# Patient Record
Sex: Male | Born: 1955 | Race: Black or African American | Hispanic: No | Marital: Married | Smoking: Former smoker
Health system: Southern US, Community
[De-identification: ages and names within clinical notes are randomized; demographics above are authoritative.]

## PROBLEM LIST (undated history)

## (undated) DIAGNOSIS — K592 Neurogenic bowel, not elsewhere classified: Secondary | ICD-10-CM

## (undated) DIAGNOSIS — D649 Anemia, unspecified: Secondary | ICD-10-CM

## (undated) DIAGNOSIS — S14101A Unspecified injury at C1 level of cervical spinal cord, initial encounter: Secondary | ICD-10-CM

## (undated) DIAGNOSIS — Z5189 Encounter for other specified aftercare: Secondary | ICD-10-CM

## (undated) DIAGNOSIS — N319 Neuromuscular dysfunction of bladder, unspecified: Secondary | ICD-10-CM

## (undated) DIAGNOSIS — G808 Other cerebral palsy: Secondary | ICD-10-CM

## (undated) DIAGNOSIS — IMO0001 Reserved for inherently not codable concepts without codable children: Secondary | ICD-10-CM

## (undated) HISTORY — DX: Unspecified injury at C1 level of cervical spinal cord, initial encounter: S14.101A

## (undated) HISTORY — DX: Neuromuscular dysfunction of bladder, unspecified: N31.9

## (undated) HISTORY — PX: BACK SURGERY: SHX140

## (undated) HISTORY — DX: Other cerebral palsy: G80.8

## (undated) HISTORY — DX: Neurogenic bowel, not elsewhere classified: K59.2

---

## 1997-06-17 ENCOUNTER — Encounter: Admission: RE | Admit: 1997-06-17 | Discharge: 1997-09-15 | Payer: Self-pay | Admitting: *Deleted

## 2006-10-30 ENCOUNTER — Emergency Department (HOSPITAL_COMMUNITY): Admission: EM | Admit: 2006-10-30 | Discharge: 2006-10-31 | Payer: Self-pay | Admitting: Emergency Medicine

## 2006-11-06 ENCOUNTER — Encounter: Admission: RE | Admit: 2006-11-06 | Discharge: 2006-11-06 | Payer: Self-pay | Admitting: Family Medicine

## 2007-03-17 ENCOUNTER — Encounter: Admission: RE | Admit: 2007-03-17 | Discharge: 2007-04-15 | Payer: Self-pay | Admitting: Neurology

## 2007-10-30 ENCOUNTER — Encounter: Admission: RE | Admit: 2007-10-30 | Discharge: 2007-10-30 | Payer: Self-pay | Admitting: Neurology

## 2007-12-16 ENCOUNTER — Encounter: Admission: RE | Admit: 2007-12-16 | Discharge: 2007-12-16 | Payer: Self-pay | Admitting: Neurology

## 2008-05-19 ENCOUNTER — Inpatient Hospital Stay (HOSPITAL_COMMUNITY): Admission: RE | Admit: 2008-05-19 | Discharge: 2008-05-19 | Payer: Self-pay | Admitting: Neurosurgery

## 2008-12-28 ENCOUNTER — Ambulatory Visit (HOSPITAL_COMMUNITY): Admission: RE | Admit: 2008-12-28 | Discharge: 2008-12-28 | Payer: Self-pay | Admitting: Neurosurgery

## 2009-07-02 ENCOUNTER — Inpatient Hospital Stay (HOSPITAL_COMMUNITY): Admission: AC | Admit: 2009-07-02 | Discharge: 2009-07-27 | Payer: Self-pay

## 2009-07-06 ENCOUNTER — Ambulatory Visit: Payer: Self-pay | Admitting: Physical Medicine & Rehabilitation

## 2009-07-14 ENCOUNTER — Ambulatory Visit: Payer: Self-pay | Admitting: Surgery

## 2009-07-15 ENCOUNTER — Encounter (INDEPENDENT_AMBULATORY_CARE_PROVIDER_SITE_OTHER): Payer: Self-pay | Admitting: General Surgery

## 2009-07-27 ENCOUNTER — Ambulatory Visit: Payer: Self-pay | Admitting: Physical Medicine & Rehabilitation

## 2009-07-27 ENCOUNTER — Inpatient Hospital Stay (HOSPITAL_COMMUNITY)
Admission: RE | Admit: 2009-07-27 | Discharge: 2009-08-27 | Payer: Self-pay | Admitting: Physical Medicine & Rehabilitation

## 2009-07-30 ENCOUNTER — Ambulatory Visit: Payer: Self-pay | Admitting: Physical Medicine & Rehabilitation

## 2009-08-19 ENCOUNTER — Ambulatory Visit: Payer: Self-pay | Admitting: Physical Medicine & Rehabilitation

## 2009-08-19 ENCOUNTER — Ambulatory Visit: Payer: Self-pay | Admitting: Dentistry

## 2009-09-07 ENCOUNTER — Ambulatory Visit: Payer: Self-pay | Admitting: Emergency Medicine

## 2009-09-07 ENCOUNTER — Inpatient Hospital Stay (HOSPITAL_COMMUNITY): Admission: EM | Admit: 2009-09-07 | Discharge: 2009-09-29 | Payer: Self-pay | Admitting: Emergency Medicine

## 2009-09-14 ENCOUNTER — Ambulatory Visit: Payer: Self-pay | Admitting: Infectious Disease

## 2009-09-21 ENCOUNTER — Ambulatory Visit: Payer: Self-pay | Admitting: Physical Medicine & Rehabilitation

## 2009-09-22 ENCOUNTER — Encounter
Admission: RE | Admit: 2009-09-22 | Discharge: 2009-12-15 | Payer: Self-pay | Source: Home / Self Care | Attending: Physical Medicine & Rehabilitation | Admitting: Physical Medicine & Rehabilitation

## 2009-10-26 ENCOUNTER — Ambulatory Visit: Payer: Self-pay | Admitting: Physical Medicine & Rehabilitation

## 2009-10-27 ENCOUNTER — Encounter (HOSPITAL_BASED_OUTPATIENT_CLINIC_OR_DEPARTMENT_OTHER)
Admission: RE | Admit: 2009-10-27 | Discharge: 2010-01-25 | Payer: Self-pay | Source: Home / Self Care | Attending: General Surgery | Admitting: General Surgery

## 2009-11-23 ENCOUNTER — Ambulatory Visit: Payer: Self-pay | Admitting: Physical Medicine & Rehabilitation

## 2009-12-15 ENCOUNTER — Encounter
Admission: RE | Admit: 2009-12-15 | Discharge: 2009-12-22 | Payer: Self-pay | Source: Home / Self Care | Attending: Physical Medicine & Rehabilitation | Admitting: Physical Medicine & Rehabilitation

## 2009-12-22 ENCOUNTER — Ambulatory Visit: Payer: Self-pay | Admitting: Physical Medicine & Rehabilitation

## 2010-01-20 ENCOUNTER — Encounter
Admission: RE | Admit: 2010-01-20 | Discharge: 2010-01-31 | Payer: Self-pay | Source: Home / Self Care | Attending: Physical Medicine & Rehabilitation | Admitting: Physical Medicine & Rehabilitation

## 2010-01-22 ENCOUNTER — Encounter: Payer: Self-pay | Admitting: Physical Medicine & Rehabilitation

## 2010-01-25 ENCOUNTER — Ambulatory Visit
Admission: RE | Admit: 2010-01-25 | Discharge: 2010-01-25 | Payer: Self-pay | Source: Home / Self Care | Attending: Physical Medicine & Rehabilitation | Admitting: Physical Medicine & Rehabilitation

## 2010-02-03 ENCOUNTER — Encounter (HOSPITAL_BASED_OUTPATIENT_CLINIC_OR_DEPARTMENT_OTHER): Payer: BC Managed Care – PPO | Attending: General Surgery

## 2010-02-03 DIAGNOSIS — L8990 Pressure ulcer of unspecified site, unspecified stage: Secondary | ICD-10-CM | POA: Insufficient documentation

## 2010-02-03 DIAGNOSIS — L89209 Pressure ulcer of unspecified hip, unspecified stage: Secondary | ICD-10-CM | POA: Insufficient documentation

## 2010-02-03 DIAGNOSIS — L89899 Pressure ulcer of other site, unspecified stage: Secondary | ICD-10-CM | POA: Insufficient documentation

## 2010-02-03 DIAGNOSIS — L89009 Pressure ulcer of unspecified elbow, unspecified stage: Secondary | ICD-10-CM | POA: Insufficient documentation

## 2010-02-03 DIAGNOSIS — L89109 Pressure ulcer of unspecified part of back, unspecified stage: Secondary | ICD-10-CM | POA: Insufficient documentation

## 2010-02-03 DIAGNOSIS — G8220 Paraplegia, unspecified: Secondary | ICD-10-CM | POA: Insufficient documentation

## 2010-02-22 ENCOUNTER — Encounter: Payer: BC Managed Care – PPO | Attending: Physical Medicine & Rehabilitation

## 2010-02-22 ENCOUNTER — Ambulatory Visit: Payer: BC Managed Care – PPO | Admitting: Physical Medicine & Rehabilitation

## 2010-02-22 DIAGNOSIS — M25519 Pain in unspecified shoulder: Secondary | ICD-10-CM | POA: Insufficient documentation

## 2010-02-22 DIAGNOSIS — N319 Neuromuscular dysfunction of bladder, unspecified: Secondary | ICD-10-CM | POA: Insufficient documentation

## 2010-02-22 DIAGNOSIS — L8990 Pressure ulcer of unspecified site, unspecified stage: Secondary | ICD-10-CM

## 2010-02-22 DIAGNOSIS — K592 Neurogenic bowel, not elsewhere classified: Secondary | ICD-10-CM

## 2010-02-22 DIAGNOSIS — G8254 Quadriplegia, C5-C7 incomplete: Secondary | ICD-10-CM | POA: Insufficient documentation

## 2010-02-22 DIAGNOSIS — S14101A Unspecified injury at C1 level of cervical spinal cord, initial encounter: Secondary | ICD-10-CM

## 2010-02-22 DIAGNOSIS — M7500 Adhesive capsulitis of unspecified shoulder: Secondary | ICD-10-CM | POA: Insufficient documentation

## 2010-02-22 DIAGNOSIS — R259 Unspecified abnormal involuntary movements: Secondary | ICD-10-CM | POA: Insufficient documentation

## 2010-02-22 DIAGNOSIS — X58XXXS Exposure to other specified factors, sequela: Secondary | ICD-10-CM | POA: Insufficient documentation

## 2010-02-22 DIAGNOSIS — IMO0002 Reserved for concepts with insufficient information to code with codable children: Secondary | ICD-10-CM | POA: Insufficient documentation

## 2010-03-09 ENCOUNTER — Encounter (HOSPITAL_BASED_OUTPATIENT_CLINIC_OR_DEPARTMENT_OTHER): Payer: BC Managed Care – PPO | Attending: General Surgery

## 2010-03-09 DIAGNOSIS — L89209 Pressure ulcer of unspecified hip, unspecified stage: Secondary | ICD-10-CM | POA: Insufficient documentation

## 2010-03-09 DIAGNOSIS — G8220 Paraplegia, unspecified: Secondary | ICD-10-CM | POA: Insufficient documentation

## 2010-03-09 DIAGNOSIS — L89109 Pressure ulcer of unspecified part of back, unspecified stage: Secondary | ICD-10-CM | POA: Insufficient documentation

## 2010-03-09 DIAGNOSIS — L8990 Pressure ulcer of unspecified site, unspecified stage: Secondary | ICD-10-CM | POA: Insufficient documentation

## 2010-03-09 DIAGNOSIS — L89899 Pressure ulcer of other site, unspecified stage: Secondary | ICD-10-CM | POA: Insufficient documentation

## 2010-03-09 DIAGNOSIS — L89009 Pressure ulcer of unspecified elbow, unspecified stage: Secondary | ICD-10-CM | POA: Insufficient documentation

## 2010-03-16 LAB — BASIC METABOLIC PANEL
BUN: 1 mg/dL — ABNORMAL LOW (ref 6–23)
BUN: 1 mg/dL — ABNORMAL LOW (ref 6–23)
BUN: 2 mg/dL — ABNORMAL LOW (ref 6–23)
BUN: 2 mg/dL — ABNORMAL LOW (ref 6–23)
BUN: <1 mg/dL — ABNORMAL LOW (ref 6–23)
BUN: 24 mg/dL — ABNORMAL HIGH (ref 6–23)
BUN: 5 mg/dL — ABNORMAL LOW (ref 6–23)
BUN: 6 mg/dL (ref 6–23)
BUN: 6 mg/dL (ref 6–23)
BUN: 8 mg/dL (ref 6–23)
CO2: 19 mEq/L (ref 19–32)
CO2: 19 mEq/L (ref 19–32)
CO2: 21 mEq/L (ref 19–32)
CO2: 21 mEq/L (ref 19–32)
CO2: 22 mEq/L (ref 19–32)
CO2: 22 mEq/L (ref 19–32)
CO2: 25 mEq/L (ref 19–32)
CO2: 15 mEq/L — ABNORMAL LOW (ref 19–32)
CO2: 24 mEq/L (ref 19–32)
CO2: 26 mEq/L (ref 19–32)
Calcium: 7.8 mg/dL — ABNORMAL LOW (ref 8.4–10.5)
Calcium: 8 mg/dL — ABNORMAL LOW (ref 8.4–10.5)
Calcium: 8.1 mg/dL — ABNORMAL LOW (ref 8.4–10.5)
Calcium: 8.3 mg/dL — ABNORMAL LOW (ref 8.4–10.5)
Calcium: 8.5 mg/dL (ref 8.4–10.5)
Calcium: 8.8 mg/dL (ref 8.4–10.5)
Chloride: 113 mEq/L — ABNORMAL HIGH (ref 96–112)
Chloride: 115 mEq/L — ABNORMAL HIGH (ref 96–112)
Chloride: 115 mEq/L — ABNORMAL HIGH (ref 96–112)
Chloride: 117 mEq/L — ABNORMAL HIGH (ref 96–112)
Chloride: 118 mEq/L — ABNORMAL HIGH (ref 96–112)
Chloride: 102 mEq/L (ref 96–112)
Chloride: 108 mEq/L (ref 96–112)
Chloride: 110 mEq/L (ref 96–112)
Chloride: 113 mEq/L — ABNORMAL HIGH (ref 96–112)
Creatinine, Ser: 0.46 mg/dL (ref 0.4–1.5)
Creatinine, Ser: 0.51 mg/dL (ref 0.4–1.5)
Creatinine, Ser: 0.51 mg/dL (ref 0.4–1.5)
Creatinine, Ser: 0.57 mg/dL (ref 0.4–1.5)
Creatinine, Ser: 0.57 mg/dL (ref 0.4–1.5)
Creatinine, Ser: 0.58 mg/dL (ref 0.4–1.5)
Creatinine, Ser: 0.59 mg/dL (ref 0.4–1.5)
Creatinine, Ser: 0.6 mg/dL (ref 0.4–1.5)
Creatinine, Ser: 0.69 mg/dL (ref 0.4–1.5)
Creatinine, Ser: 0.7 mg/dL (ref 0.4–1.5)
Creatinine, Ser: 0.73 mg/dL (ref 0.4–1.5)
Creatinine, Ser: 1.84 mg/dL — ABNORMAL HIGH (ref 0.4–1.5)
GFR calc Af Amer: >60 mL/min (ref >60)
GFR calc Af Amer: >60 mL/min (ref >60)
GFR calc Af Amer: >60 mL/min (ref >60)
GFR calc Af Amer: >60 mL/min (ref >60)
GFR calc Af Amer: >60 mL/min (ref >60)
GFR calc Af Amer: >60 mL/min (ref >60)
GFR calc Af Amer: >60 mL/min (ref >60)
GFR calc non Af Amer: >60 mL/min (ref >60)
GFR calc non Af Amer: >60 mL/min (ref >60)
GFR calc non Af Amer: >60 mL/min (ref >60)
GFR calc non Af Amer: >60 mL/min (ref >60)
GFR calc non Af Amer: >60 mL/min (ref >60)
GFR calc non Af Amer: >60 mL/min (ref >60)
GFR calc non Af Amer: >60 mL/min (ref >60)
Glucose, Bld: 80 mg/dL (ref 70–99)
Glucose, Bld: 80 mg/dL (ref 70–99)
Glucose, Bld: 84 mg/dL (ref 70–99)
Glucose, Bld: 86 mg/dL (ref 70–99)
Glucose, Bld: 88 mg/dL (ref 70–99)
Glucose, Bld: 88 mg/dL (ref 70–99)
Glucose, Bld: 112 mg/dL — ABNORMAL HIGH (ref 70–99)
Glucose, Bld: 70 mg/dL (ref 70–99)
Glucose, Bld: 82 mg/dL (ref 70–99)
Glucose, Bld: 87 mg/dL (ref 70–99)
Potassium: 3 mEq/L — ABNORMAL LOW (ref 3.5–5.1)
Potassium: 3.3 mEq/L — ABNORMAL LOW (ref 3.5–5.1)
Potassium: 3.3 mEq/L — ABNORMAL LOW (ref 3.5–5.1)
Potassium: 2.3 mEq/L — CL (ref 3.5–5.1)
Potassium: 3 mEq/L — ABNORMAL LOW (ref 3.5–5.1)
Potassium: 3.1 mEq/L — ABNORMAL LOW (ref 3.5–5.1)
Potassium: 3.6 mEq/L (ref 3.5–5.1)
Sodium: 140 mEq/L (ref 135–145)
Sodium: 140 mEq/L (ref 135–145)
Sodium: 142 mEq/L (ref 135–145)
Sodium: 144 mEq/L (ref 135–145)
Sodium: 133 mEq/L — ABNORMAL LOW (ref 135–145)
Sodium: 135 mEq/L (ref 135–145)

## 2010-03-16 LAB — CBC
HCT: 21.4 % — ABNORMAL LOW (ref 39.0–52.0)
HCT: 22.4 % — ABNORMAL LOW (ref 39.0–52.0)
HCT: 22.5 % — ABNORMAL LOW (ref 39.0–52.0)
HCT: 23.4 % — ABNORMAL LOW (ref 39.0–52.0)
HCT: 24.6 % — ABNORMAL LOW (ref 39.0–52.0)
HCT: 24.5 % — ABNORMAL LOW (ref 39.0–52.0)
HCT: 24.9 % — ABNORMAL LOW (ref 39.0–52.0)
HCT: 25.9 % — ABNORMAL LOW (ref 39.0–52.0)
HCT: 27 % — ABNORMAL LOW (ref 39.0–52.0)
HCT: 27.1 % — ABNORMAL LOW (ref 39.0–52.0)
HCT: 32.3 % — ABNORMAL LOW (ref 39.0–52.0)
Hemoglobin: 7.1 g/dL — ABNORMAL LOW (ref 13.0–17.0)
Hemoglobin: 8.2 g/dL — ABNORMAL LOW (ref 13.0–17.0)
Hemoglobin: 8.3 g/dL — ABNORMAL LOW (ref 13.0–17.0)
Hemoglobin: 8.3 g/dL — ABNORMAL LOW (ref 13.0–17.0)
Hemoglobin: 11.1 g/dL — ABNORMAL LOW (ref 13.0–17.0)
Hemoglobin: 8.5 g/dL — ABNORMAL LOW (ref 13.0–17.0)
Hemoglobin: 8.5 g/dL — ABNORMAL LOW (ref 13.0–17.0)
Hemoglobin: 8.6 g/dL — ABNORMAL LOW (ref 13.0–17.0)
MCH: 26.6 pg (ref 26.0–34.0)
MCH: 26.9 pg (ref 26.0–34.0)
MCH: 26.9 pg (ref 26.0–34.0)
MCH: 26.9 pg (ref 26.0–34.0)
MCH: 27.3 pg (ref 26.0–34.0)
MCH: 27.3 pg (ref 26.0–34.0)
MCH: 27.5 pg (ref 26.0–34.0)
MCH: 27.1 pg (ref 26.0–34.0)
MCH: 27.2 pg (ref 26.0–34.0)
MCH: 27.3 pg (ref 26.0–34.0)
MCH: 27.5 pg (ref 26.0–34.0)
MCH: 27.5 pg (ref 26.0–34.0)
MCH: 28.1 pg (ref 26.0–34.0)
MCHC: 32.1 g/dL (ref 30.0–36.0)
MCHC: 32.5 g/dL (ref 30.0–36.0)
MCHC: 32.9 g/dL (ref 30.0–36.0)
MCHC: 33.2 g/dL (ref 30.0–36.0)
MCHC: 33.2 g/dL (ref 30.0–36.0)
MCHC: 33.5 g/dL (ref 30.0–36.0)
MCHC: 33.7 g/dL (ref 30.0–36.0)
MCHC: 34.1 g/dL (ref 30.0–36.0)
MCHC: 34.4 g/dL (ref 30.0–36.0)
MCHC: 34.7 g/dL (ref 30.0–36.0)
MCHC: 34.7 g/dL (ref 30.0–36.0)
MCV: 79.9 fL (ref 78.0–100.0)
MCV: 81.8 fL (ref 78.0–100.0)
MCV: 82.3 fL (ref 78.0–100.0)
MCV: 82.4 fL (ref 78.0–100.0)
MCV: 83.3 fL (ref 78.0–100.0)
MCV: 79.6 fL (ref 78.0–100.0)
MCV: 80.9 fL (ref 78.0–100.0)
MCV: 80.9 fL (ref 78.0–100.0)
MCV: 81.6 fL (ref 78.0–100.0)
Platelets: 527 10*3/uL — ABNORMAL HIGH (ref 150–400)
Platelets: 529 10*3/uL — ABNORMAL HIGH (ref 150–400)
Platelets: 548 10*3/uL — ABNORMAL HIGH (ref 150–400)
Platelets: 550 10*3/uL — ABNORMAL HIGH (ref 150–400)
Platelets: 551 10*3/uL — ABNORMAL HIGH (ref 150–400)
Platelets: 661 10*3/uL — ABNORMAL HIGH (ref 150–400)
Platelets: 669 10*3/uL — ABNORMAL HIGH (ref 150–400)
Platelets: 697 10*3/uL — ABNORMAL HIGH (ref 150–400)
Platelets: 289 10*3/uL (ref 150–400)
Platelets: 427 10*3/uL — ABNORMAL HIGH (ref 150–400)
Platelets: 483 10*3/uL — ABNORMAL HIGH (ref 150–400)
RBC: 2.78 MIL/uL — ABNORMAL LOW (ref 4.22–5.81)
RBC: 2.78 MIL/uL — ABNORMAL LOW (ref 4.22–5.81)
RBC: 2.84 MIL/uL — ABNORMAL LOW (ref 4.22–5.81)
RBC: 2.9 MIL/uL — ABNORMAL LOW (ref 4.22–5.81)
RBC: 3.01 MIL/uL — ABNORMAL LOW (ref 4.22–5.81)
RBC: 3.06 MIL/uL — ABNORMAL LOW (ref 4.22–5.81)
RBC: 3.06 MIL/uL — ABNORMAL LOW (ref 4.22–5.81)
RBC: 3.08 MIL/uL — ABNORMAL LOW (ref 4.22–5.81)
RBC: 3.17 MIL/uL — ABNORMAL LOW (ref 4.22–5.81)
RBC: 3.67 MIL/uL — ABNORMAL LOW (ref 4.22–5.81)
RDW: 16.7 % — ABNORMAL HIGH (ref 11.5–15.5)
RDW: 17.1 % — ABNORMAL HIGH (ref 11.5–15.5)
RDW: 17.5 % — ABNORMAL HIGH (ref 11.5–15.5)
RDW: 17.8 % — ABNORMAL HIGH (ref 11.5–15.5)
RDW: 17.8 % — ABNORMAL HIGH (ref 11.5–15.5)
RDW: 15.2 % (ref 11.5–15.5)
RDW: 15.2 % (ref 11.5–15.5)
RDW: 15.6 % — ABNORMAL HIGH (ref 11.5–15.5)
RDW: 15.8 % — ABNORMAL HIGH (ref 11.5–15.5)
WBC: 15 10*3/uL — ABNORMAL HIGH (ref 4.0–10.5)
WBC: 18.9 10*3/uL — ABNORMAL HIGH (ref 4.0–10.5)
WBC: 19.2 10*3/uL — ABNORMAL HIGH (ref 4.0–10.5)
WBC: 7.6 10*3/uL (ref 4.0–10.5)
WBC: 8.1 10*3/uL (ref 4.0–10.5)
WBC: 8.9 10*3/uL (ref 4.0–10.5)
WBC: 17.8 10*3/uL — ABNORMAL HIGH (ref 4.0–10.5)
WBC: 23.1 10*3/uL — ABNORMAL HIGH (ref 4.0–10.5)
WBC: 23.4 10*3/uL — ABNORMAL HIGH (ref 4.0–10.5)

## 2010-03-16 LAB — COMPREHENSIVE METABOLIC PANEL
ALT: 22 U/L (ref 0–53)
ALT: 27 U/L (ref 0–53)
AST: 42 U/L — ABNORMAL HIGH (ref 0–37)
Albumin: 1.7 g/dL — ABNORMAL LOW (ref 3.5–5.2)
Alkaline Phosphatase: 73 U/L (ref 39–117)
CO2: 19 mEq/L (ref 19–32)
CO2: 19 mEq/L (ref 19–32)
Calcium: 7.5 mg/dL — ABNORMAL LOW (ref 8.4–10.5)
Calcium: 7.6 mg/dL — ABNORMAL LOW (ref 8.4–10.5)
Calcium: 8 mg/dL — ABNORMAL LOW (ref 8.4–10.5)
Chloride: 109 mEq/L (ref 96–112)
Chloride: 110 mEq/L (ref 96–112)
Creatinine, Ser: 2.75 mg/dL — ABNORMAL HIGH (ref 0.4–1.5)
GFR calc Af Amer: 29 mL/min — ABNORMAL LOW (ref >60)
GFR calc non Af Amer: 13 mL/min — ABNORMAL LOW (ref >60)
GFR calc non Af Amer: 56 mL/min — ABNORMAL LOW (ref >60)
Glucose, Bld: 117 mg/dL — ABNORMAL HIGH (ref 70–99)
Glucose, Bld: 90 mg/dL (ref 70–99)
Sodium: 132 mEq/L — ABNORMAL LOW (ref 135–145)
Sodium: 138 mEq/L (ref 135–145)
Total Bilirubin: 0.4 mg/dL (ref 0.3–1.2)
Total Bilirubin: 0.7 mg/dL (ref 0.3–1.2)

## 2010-03-16 LAB — DIFFERENTIAL
Basophils Absolute: 0 10*3/uL (ref 0.0–0.1)
Basophils Absolute: 0 10*3/uL (ref 0.0–0.1)
Basophils Relative: 0 % (ref 0–1)
Eosinophils Absolute: 0 10*3/uL (ref 0.0–0.7)
Eosinophils Absolute: 0 10*3/uL (ref 0.0–0.7)
Eosinophils Relative: 0 % (ref 0–5)
Lymphocytes Relative: 3 % — ABNORMAL LOW (ref 12–46)
Lymphocytes Relative: 6 % — ABNORMAL LOW (ref 12–46)
Lymphs Abs: 1.1 10*3/uL (ref 0.7–4.0)
Lymphs Abs: 3.6 10*3/uL (ref 0.7–4.0)
Monocytes Absolute: 1.1 10*3/uL — ABNORMAL HIGH (ref 0.1–1.0)
Monocytes Absolute: 2.4 10*3/uL — ABNORMAL HIGH (ref 0.1–1.0)
Monocytes Relative: 6 % (ref 3–12)
Monocytes Relative: 7 % (ref 3–12)
Monocytes Relative: 9 % (ref 3–12)
Neutro Abs: 19.2 10*3/uL — ABNORMAL HIGH (ref 1.7–7.7)
Neutro Abs: 27.7 10*3/uL — ABNORMAL HIGH (ref 1.7–7.7)
Neutro Abs: 34.4 10*3/uL — ABNORMAL HIGH (ref 1.7–7.7)
Neutrophils Relative %: 75 % (ref 43–77)
Neutrophils Relative %: 86 % — ABNORMAL HIGH (ref 43–77)

## 2010-03-16 LAB — MRSA PCR SCREENING: MRSA by PCR: NEGATIVE

## 2010-03-16 LAB — CLOSTRIDIUM DIFFICILE EIA: C difficile Toxins A+B, EIA: NEGATIVE

## 2010-03-16 LAB — GIARDIA/CRYPTOSPORIDIUM SCREEN(EIA)
Cryptosporidium Screen (EIA): NEGATIVE
Giardia Screen - EIA: NEGATIVE

## 2010-03-16 LAB — POCT I-STAT 3, ART BLOOD GAS (G3+)
Bicarbonate: 21.9 mEq/L (ref 20.0–24.0)
O2 Saturation: 97 %
TCO2: 23 mmol/L (ref 0–100)
pCO2 arterial: 29.6 mmHg — ABNORMAL LOW (ref 35.0–45.0)
pCO2 arterial: 32.1 mmHg — ABNORMAL LOW (ref 35.0–45.0)
pH, Arterial: 7.316 — ABNORMAL LOW (ref 7.350–7.450)
pH, Arterial: 7.442 (ref 7.350–7.450)
pO2, Arterial: 81 mmHg (ref 80.0–100.0)

## 2010-03-16 LAB — GLUCOSE, CAPILLARY
Glucose-Capillary: 83 mg/dL (ref 70–99)
Glucose-Capillary: 85 mg/dL (ref 70–99)

## 2010-03-16 LAB — URINALYSIS, ROUTINE W REFLEX MICROSCOPIC
Glucose, UA: NEGATIVE mg/dL
Specific Gravity, Urine: 1.021 (ref 1.005–1.030)

## 2010-03-16 LAB — TYPE AND SCREEN
ABO/RH(D): A POS
Antibody Screen: NEGATIVE

## 2010-03-16 LAB — IRON AND TIBC

## 2010-03-16 LAB — URINE MICROSCOPIC-ADD ON

## 2010-03-16 LAB — CULTURE, BLOOD (ROUTINE X 2)
Culture: NO GROWTH
Culture: NO GROWTH

## 2010-03-16 LAB — PHOSPHORUS
Phosphorus: 2.9 mg/dL (ref 2.3–4.6)
Phosphorus: 4.3 mg/dL (ref 2.3–4.6)

## 2010-03-16 LAB — HEMOGLOBIN A1C: Mean Plasma Glucose: 111 mg/dL (ref <117)

## 2010-03-16 LAB — LACTIC ACID, PLASMA: Lactic Acid, Venous: 0.9 mmol/L (ref 0.5–2.2)

## 2010-03-16 LAB — TROPONIN I
Troponin I: 0.03 ng/mL (ref 0.00–0.06)
Troponin I: 0.07 ng/mL — ABNORMAL HIGH (ref 0.00–0.06)

## 2010-03-16 LAB — CARBOXYHEMOGLOBIN
Carboxyhemoglobin: 0.8 % (ref 0.5–1.5)
Methemoglobin: 0.8 % (ref 0.0–1.5)
O2 Saturation: 64.1 %
O2 Saturation: 76.6 %
Total hemoglobin: 9.6 g/dL — ABNORMAL LOW (ref 13.5–18.0)

## 2010-03-16 LAB — CK TOTAL AND CKMB (NOT AT ARMC)
CK, MB: 1.6 ng/mL (ref 0.3–4.0)
Relative Index: INVALID (ref 0.0–2.5)

## 2010-03-16 LAB — PROCALCITONIN: Procalcitonin: 7.69 ng/mL

## 2010-03-16 LAB — URINE CULTURE: Culture: NO GROWTH

## 2010-03-16 LAB — LIPASE, BLOOD: Lipase: 15 U/L (ref 11–59)

## 2010-03-16 LAB — TSH: TSH: 2.073 u[IU]/mL (ref 0.350–4.500)

## 2010-03-16 LAB — FOLATE: Folate: 8.5 ng/mL

## 2010-03-16 LAB — PROTEIN, URINE, RANDOM: Total Protein, Urine: 117 mg/dL

## 2010-03-16 LAB — HIV ANTIBODY (ROUTINE TESTING W REFLEX): HIV: NONREACTIVE

## 2010-03-16 LAB — OSMOLALITY, URINE: Osmolality, Ur: 439 mOsm/kg (ref 390–1090)

## 2010-03-16 LAB — STOOL CULTURE

## 2010-03-16 LAB — SEDIMENTATION RATE: Sed Rate: 17 mm/hr — ABNORMAL HIGH (ref 0–16)

## 2010-03-16 LAB — RETICULOCYTES
RBC.: 2.74 MIL/uL — ABNORMAL LOW (ref 4.22–5.81)
Retic Ct Pct: 0.9 % (ref 0.4–3.1)

## 2010-03-16 LAB — FECAL LACTOFERRIN, QUANT: Fecal Lactoferrin: POSITIVE

## 2010-03-17 LAB — BASIC METABOLIC PANEL
BUN: 6 mg/dL (ref 6–23)
Calcium: 9.5 mg/dL (ref 8.4–10.5)
Chloride: 102 mEq/L (ref 96–112)
GFR calc Af Amer: >60 mL/min (ref >60)
GFR calc non Af Amer: >60 mL/min (ref >60)
Glucose, Bld: 93 mg/dL (ref 70–99)
Glucose, Bld: 93 mg/dL (ref 70–99)
Potassium: 3.7 mEq/L (ref 3.5–5.1)
Sodium: 134 mEq/L — ABNORMAL LOW (ref 135–145)

## 2010-03-17 LAB — URINALYSIS, ROUTINE W REFLEX MICROSCOPIC
Glucose, UA: NEGATIVE mg/dL
Hgb urine dipstick: NEGATIVE
Specific Gravity, Urine: 1.016 (ref 1.005–1.030)
pH: 6.5 (ref 5.0–8.0)

## 2010-03-17 LAB — CBC
HCT: 27.4 % — ABNORMAL LOW (ref 39.0–52.0)
HCT: 30.1 % — ABNORMAL LOW (ref 39.0–52.0)
HCT: 27.3 % — ABNORMAL LOW (ref 39.0–52.0)
Hemoglobin: 9.3 g/dL — ABNORMAL LOW (ref 13.0–17.0)
MCH: 28.7 pg (ref 26.0–34.0)
MCH: 29.1 pg (ref 26.0–34.0)
MCHC: 32.8 g/dL (ref 30.0–36.0)
MCHC: 32.9 g/dL (ref 30.0–36.0)
MCHC: 32.4 g/dL (ref 30.0–36.0)
MCV: 87 fL (ref 78.0–100.0)
MCV: 87.2 fL (ref 78.0–100.0)
MCV: 89.3 fL (ref 78.0–100.0)
MCV: 90.7 fL (ref 78.0–100.0)
Platelets: 329 10*3/uL (ref 150–400)
Platelets: 339 10*3/uL (ref 150–400)
Platelets: 602 10*3/uL — ABNORMAL HIGH (ref 150–400)
RBC: 3.27 MIL/uL — ABNORMAL LOW (ref 4.22–5.81)
RBC: 3.36 MIL/uL — ABNORMAL LOW (ref 4.22–5.81)
RBC: 3.01 MIL/uL — ABNORMAL LOW (ref 4.22–5.81)
RBC: 3.19 MIL/uL — ABNORMAL LOW (ref 4.22–5.81)
RDW: 14.6 % (ref 11.5–15.5)
RDW: 15.6 % — ABNORMAL HIGH (ref 11.5–15.5)
WBC: 17.3 10*3/uL — ABNORMAL HIGH (ref 4.0–10.5)
WBC: 7 10*3/uL (ref 4.0–10.5)
WBC: 10.1 10*3/uL (ref 4.0–10.5)

## 2010-03-17 LAB — URINE CULTURE

## 2010-03-17 LAB — DIFFERENTIAL
Eosinophils Absolute: 0.2 10*3/uL (ref 0.0–0.7)
Eosinophils Relative: 0 % (ref 0–5)
Lymphocytes Relative: 19 % (ref 12–46)
Lymphocytes Relative: 23 % (ref 12–46)
Lymphs Abs: 1.6 10*3/uL (ref 0.7–4.0)
Lymphs Abs: 3.2 10*3/uL (ref 0.7–4.0)
Neutrophils Relative %: 71 % (ref 43–77)

## 2010-03-17 LAB — URINE MICROSCOPIC-ADD ON

## 2010-03-17 LAB — COMPREHENSIVE METABOLIC PANEL
Alkaline Phosphatase: 118 U/L — ABNORMAL HIGH (ref 39–117)
BUN: 12 mg/dL (ref 6–23)
Chloride: 97 mEq/L (ref 96–112)
GFR calc non Af Amer: >60 mL/min (ref >60)
Glucose, Bld: 97 mg/dL (ref 70–99)
Potassium: 4 mEq/L (ref 3.5–5.1)
Total Bilirubin: 0.3 mg/dL (ref 0.3–1.2)

## 2010-03-17 LAB — CULTURE, BLOOD (ROUTINE X 2)

## 2010-03-17 LAB — APTT: aPTT: 38 seconds — ABNORMAL HIGH (ref 24–37)

## 2010-03-17 LAB — GLUCOSE, CAPILLARY: Glucose-Capillary: 106 mg/dL — ABNORMAL HIGH (ref 70–99)

## 2010-03-18 LAB — COMPREHENSIVE METABOLIC PANEL
Albumin: 2.8 g/dL — ABNORMAL LOW (ref 3.5–5.2)
Alkaline Phosphatase: 148 U/L — ABNORMAL HIGH (ref 39–117)
Alkaline Phosphatase: 196 U/L — ABNORMAL HIGH (ref 39–117)
BUN: 17 mg/dL (ref 6–23)
BUN: 12 mg/dL (ref 6–23)
CO2: 35 mEq/L — ABNORMAL HIGH (ref 19–32)
Calcium: 9.5 mg/dL (ref 8.4–10.5)
Chloride: 92 mEq/L — ABNORMAL LOW (ref 96–112)
Creatinine, Ser: 0.76 mg/dL (ref 0.4–1.5)
GFR calc non Af Amer: >60 mL/min (ref >60)
Glucose, Bld: 122 mg/dL — ABNORMAL HIGH (ref 70–99)
Potassium: 4.4 mEq/L (ref 3.5–5.1)
Potassium: 4.8 mEq/L (ref 3.5–5.1)
Total Bilirubin: 0.5 mg/dL (ref 0.3–1.2)
Total Protein: 6.9 g/dL (ref 6.0–8.3)

## 2010-03-18 LAB — BLOOD GAS, ARTERIAL
Bicarbonate: 37.5 mEq/L — ABNORMAL HIGH (ref 20.0–24.0)
Drawn by: 317771
MECHVT: 500 mL
O2 Saturation: 98.2 %
PEEP: 8 cmH2O
Patient temperature: 98.7
RATE: 15 resp/min
TCO2: 39.2 mmol/L (ref 0–100)
pCO2 arterial: 70.9 mmHg (ref 35.0–45.0)
pH, Arterial: 7.449 (ref 7.350–7.450)
pO2, Arterial: 106 mmHg — ABNORMAL HIGH (ref 80.0–100.0)
pO2, Arterial: 72 mmHg — ABNORMAL LOW (ref 80.0–100.0)

## 2010-03-18 LAB — BASIC METABOLIC PANEL
BUN: 12 mg/dL (ref 6–23)
BUN: 13 mg/dL (ref 6–23)
CO2: 26 mEq/L (ref 19–32)
CO2: 28 mEq/L (ref 19–32)
CO2: 29 mEq/L (ref 19–32)
Calcium: 9 mg/dL (ref 8.4–10.5)
Calcium: 9.1 mg/dL (ref 8.4–10.5)
Calcium: 9.2 mg/dL (ref 8.4–10.5)
Calcium: 9.8 mg/dL (ref 8.4–10.5)
Chloride: 101 mEq/L (ref 96–112)
Chloride: 101 mEq/L (ref 96–112)
Chloride: 96 mEq/L (ref 96–112)
Creatinine, Ser: 0.64 mg/dL (ref 0.4–1.5)
Creatinine, Ser: 0.67 mg/dL (ref 0.4–1.5)
GFR calc Af Amer: >60 mL/min (ref >60)
GFR calc Af Amer: >60 mL/min (ref >60)
GFR calc Af Amer: >60 mL/min (ref >60)
GFR calc Af Amer: >60 mL/min (ref >60)
GFR calc non Af Amer: >60 mL/min (ref >60)
GFR calc non Af Amer: >60 mL/min (ref >60)
Glucose, Bld: 121 mg/dL — ABNORMAL HIGH (ref 70–99)
Glucose, Bld: 158 mg/dL — ABNORMAL HIGH (ref 70–99)
Potassium: 4.3 mEq/L (ref 3.5–5.1)
Potassium: 4.4 mEq/L (ref 3.5–5.1)
Sodium: 130 mEq/L — ABNORMAL LOW (ref 135–145)
Sodium: 137 mEq/L (ref 135–145)

## 2010-03-18 LAB — CBC
HCT: 20.5 % — ABNORMAL LOW (ref 39.0–52.0)
HCT: 20.6 % — ABNORMAL LOW (ref 39.0–52.0)
HCT: 21.2 % — ABNORMAL LOW (ref 39.0–52.0)
HCT: 21.8 % — ABNORMAL LOW (ref 39.0–52.0)
HCT: 22.2 % — ABNORMAL LOW (ref 39.0–52.0)
HCT: 24.6 % — ABNORMAL LOW (ref 39.0–52.0)
Hemoglobin: 6.9 g/dL — CL (ref 13.0–17.0)
Hemoglobin: 7 g/dL — ABNORMAL LOW (ref 13.0–17.0)
Hemoglobin: 7.2 g/dL — ABNORMAL LOW (ref 13.0–17.0)
Hemoglobin: 8.1 g/dL — ABNORMAL LOW (ref 13.0–17.0)
Hemoglobin: 8.4 g/dL — ABNORMAL LOW (ref 13.0–17.0)
MCH: 31 pg (ref 26.0–34.0)
MCH: 31.3 pg (ref 26.0–34.0)
MCH: 31.6 pg (ref 26.0–34.0)
MCHC: 32.6 g/dL (ref 30.0–36.0)
MCHC: 32.7 g/dL (ref 30.0–36.0)
MCHC: 33 g/dL (ref 30.0–36.0)
MCHC: 33.3 g/dL (ref 30.0–36.0)
MCHC: 33.4 g/dL (ref 30.0–36.0)
MCHC: 33.8 g/dL (ref 30.0–36.0)
MCV: 94.1 fL (ref 78.0–100.0)
MCV: 94.8 fL (ref 78.0–100.0)
MCV: 94.9 fL (ref 78.0–100.0)
MCV: 95.6 fL (ref 78.0–100.0)
MCV: 96 fL (ref 78.0–100.0)
Platelets: 597 10*3/uL — ABNORMAL HIGH (ref 150–400)
Platelets: 646 10*3/uL — ABNORMAL HIGH (ref 150–400)
Platelets: 652 10*3/uL — ABNORMAL HIGH (ref 150–400)
Platelets: 662 10*3/uL — ABNORMAL HIGH (ref 150–400)
Platelets: 677 10*3/uL — ABNORMAL HIGH (ref 150–400)
RBC: 2.19 MIL/uL — ABNORMAL LOW (ref 4.22–5.81)
RBC: 2.2 MIL/uL — ABNORMAL LOW (ref 4.22–5.81)
RBC: 2.24 MIL/uL — ABNORMAL LOW (ref 4.22–5.81)
RBC: 2.27 MIL/uL — ABNORMAL LOW (ref 4.22–5.81)
RBC: 2.27 MIL/uL — ABNORMAL LOW (ref 4.22–5.81)
RBC: 2.7 MIL/uL — ABNORMAL LOW (ref 4.22–5.81)
RDW: 15.9 % — ABNORMAL HIGH (ref 11.5–15.5)
RDW: 14.6 % (ref 11.5–15.5)
RDW: 14.9 % (ref 11.5–15.5)
RDW: 15.1 % (ref 11.5–15.5)
RDW: 15.2 % (ref 11.5–15.5)
RDW: 15.4 % (ref 11.5–15.5)
WBC: 9.9 10*3/uL (ref 4.0–10.5)
WBC: 14 10*3/uL — ABNORMAL HIGH (ref 4.0–10.5)
WBC: 14.7 10*3/uL — ABNORMAL HIGH (ref 4.0–10.5)
WBC: 15.9 10*3/uL — ABNORMAL HIGH (ref 4.0–10.5)
WBC: 16.5 10*3/uL — ABNORMAL HIGH (ref 4.0–10.5)
WBC: 16.8 10*3/uL — ABNORMAL HIGH (ref 4.0–10.5)
WBC: 28.2 10*3/uL — ABNORMAL HIGH (ref 4.0–10.5)

## 2010-03-18 LAB — GLUCOSE, CAPILLARY
Glucose-Capillary: 106 mg/dL — ABNORMAL HIGH (ref 70–99)
Glucose-Capillary: 119 mg/dL — ABNORMAL HIGH (ref 70–99)
Glucose-Capillary: 88 mg/dL (ref 70–99)
Glucose-Capillary: 91 mg/dL (ref 70–99)
Glucose-Capillary: 92 mg/dL (ref 70–99)
Glucose-Capillary: 93 mg/dL (ref 70–99)
Glucose-Capillary: 99 mg/dL (ref 70–99)
Glucose-Capillary: 103 mg/dL — ABNORMAL HIGH (ref 70–99)
Glucose-Capillary: 91 mg/dL (ref 70–99)

## 2010-03-18 LAB — VITAMIN B12: Vitamin B-12: 774 pg/mL (ref 211–911)

## 2010-03-18 LAB — TYPE AND SCREEN: Antibody Screen: NEGATIVE

## 2010-03-18 LAB — PROTIME-INR: Prothrombin Time: 15.1 seconds (ref 11.6–15.2)

## 2010-03-18 LAB — DIFFERENTIAL
Eosinophils Absolute: 0.3 10*3/uL (ref 0.0–0.7)
Eosinophils Absolute: 0.3 10*3/uL (ref 0.0–0.7)
Eosinophils Relative: 2 % (ref 0–5)
Lymphocytes Relative: 29 % (ref 12–46)
Lymphocytes Relative: 12 % (ref 12–46)
Lymphocytes Relative: 16 % (ref 12–46)
Lymphs Abs: 2.9 10*3/uL (ref 0.7–4.0)
Lymphs Abs: 2.2 10*3/uL (ref 0.7–4.0)
Lymphs Abs: 2.8 10*3/uL (ref 0.7–4.0)
Monocytes Absolute: 1 10*3/uL (ref 0.1–1.0)
Monocytes Absolute: 0.6 10*3/uL (ref 0.1–1.0)
Monocytes Absolute: 0.9 10*3/uL (ref 0.1–1.0)
Monocytes Absolute: 1.2 10*3/uL — ABNORMAL HIGH (ref 0.1–1.0)
Monocytes Relative: 10 % (ref 3–12)
Monocytes Relative: 7 % (ref 3–12)
Neutro Abs: 5.5 10*3/uL (ref 1.7–7.7)
Neutro Abs: 13.3 10*3/uL — ABNORMAL HIGH (ref 1.7–7.7)
Neutrophils Relative %: 76 % (ref 43–77)
Neutrophils Relative %: 92 % — ABNORMAL HIGH (ref 43–77)

## 2010-03-18 LAB — MRSA PCR SCREENING: MRSA by PCR: NEGATIVE

## 2010-03-18 LAB — VANCOMYCIN, TROUGH: Vancomycin Tr: 18.1 ug/mL (ref 10.0–20.0)

## 2010-03-18 LAB — CARDIAC PANEL(CRET KIN+CKTOT+MB+TROPI)
Relative Index: INVALID (ref 0.0–2.5)
Troponin I: 0.03 ng/mL (ref 0.00–0.06)

## 2010-03-18 LAB — IRON AND TIBC
Iron: 35 ug/dL — ABNORMAL LOW (ref 42–135)
Saturation Ratios: 11 % — ABNORMAL LOW (ref 20–55)
TIBC: 317 ug/dL (ref 215–435)

## 2010-03-18 LAB — RETICULOCYTES: Retic Count, Absolute: 61.7 10*3/uL (ref 19.0–186.0)

## 2010-03-19 LAB — BASIC METABOLIC PANEL
BUN: 10 mg/dL (ref 6–23)
BUN: 11 mg/dL (ref 6–23)
BUN: 11 mg/dL (ref 6–23)
BUN: 12 mg/dL (ref 6–23)
BUN: 6 mg/dL (ref 6–23)
CO2: 25 mEq/L (ref 19–32)
CO2: 31 mEq/L (ref 19–32)
Calcium: 7.3 mg/dL — ABNORMAL LOW (ref 8.4–10.5)
Calcium: 8 mg/dL — ABNORMAL LOW (ref 8.4–10.5)
Chloride: 106 mEq/L (ref 96–112)
Chloride: 96 mEq/L (ref 96–112)
Chloride: 99 mEq/L (ref 96–112)
Creatinine, Ser: 0.76 mg/dL (ref 0.4–1.5)
Creatinine, Ser: 0.84 mg/dL (ref 0.4–1.5)
Creatinine, Ser: 1.06 mg/dL (ref 0.4–1.5)
GFR calc Af Amer: >60 mL/min (ref >60)
GFR calc Af Amer: >60 mL/min (ref >60)
GFR calc non Af Amer: >60 mL/min (ref >60)
GFR calc non Af Amer: >60 mL/min (ref >60)
GFR calc non Af Amer: >60 mL/min (ref >60)
GFR calc non Af Amer: >60 mL/min (ref >60)
Glucose, Bld: 160 mg/dL — ABNORMAL HIGH (ref 70–99)
Glucose, Bld: 94 mg/dL (ref 70–99)
Potassium: 3.6 mEq/L (ref 3.5–5.1)
Potassium: 4 mEq/L (ref 3.5–5.1)
Potassium: 4.2 mEq/L (ref 3.5–5.1)
Potassium: 4.8 mEq/L (ref 3.5–5.1)
Sodium: 130 mEq/L — ABNORMAL LOW (ref 135–145)
Sodium: 135 mEq/L (ref 135–145)
Sodium: 137 mEq/L (ref 135–145)

## 2010-03-19 LAB — CBC
HCT: 20.5 % — ABNORMAL LOW (ref 39.0–52.0)
HCT: 22 % — ABNORMAL LOW (ref 39.0–52.0)
HCT: 23 % — ABNORMAL LOW (ref 39.0–52.0)
HCT: 24 % — ABNORMAL LOW (ref 39.0–52.0)
HCT: 24.5 % — ABNORMAL LOW (ref 39.0–52.0)
HCT: 25.1 % — ABNORMAL LOW (ref 39.0–52.0)
HCT: 25.2 % — ABNORMAL LOW (ref 39.0–52.0)
HCT: 37.2 % — ABNORMAL LOW (ref 39.0–52.0)
Hemoglobin: 12.8 g/dL — ABNORMAL LOW (ref 13.0–17.0)
Hemoglobin: 7 g/dL — ABNORMAL LOW (ref 13.0–17.0)
Hemoglobin: 7.3 g/dL — ABNORMAL LOW (ref 13.0–17.0)
Hemoglobin: 7.5 g/dL — ABNORMAL LOW (ref 13.0–17.0)
Hemoglobin: 7.6 g/dL — ABNORMAL LOW (ref 13.0–17.0)
Hemoglobin: 8.1 g/dL — ABNORMAL LOW (ref 13.0–17.0)
Hemoglobin: 8.5 g/dL — ABNORMAL LOW (ref 13.0–17.0)
Hemoglobin: 8.5 g/dL — ABNORMAL LOW (ref 13.0–17.0)
MCH: 31.6 pg (ref 26.0–34.0)
MCH: 31.6 pg (ref 26.0–34.0)
MCH: 31.8 pg (ref 26.0–34.0)
MCH: 32.1 pg (ref 26.0–34.0)
MCH: 32.1 pg (ref 26.0–34.0)
MCH: 32.1 pg (ref 26.0–34.0)
MCH: 32.2 pg (ref 26.0–34.0)
MCH: 32.4 pg (ref 26.0–34.0)
MCH: 32.4 pg (ref 26.0–34.0)
MCHC: 32.9 g/dL (ref 30.0–36.0)
MCHC: 32.9 g/dL (ref 30.0–36.0)
MCHC: 33.2 g/dL (ref 30.0–36.0)
MCHC: 33.5 g/dL (ref 30.0–36.0)
MCHC: 33.9 g/dL (ref 30.0–36.0)
MCHC: 34.1 g/dL (ref 30.0–36.0)
MCHC: 34.1 g/dL (ref 30.0–36.0)
MCV: 94.9 fL (ref 78.0–100.0)
MCV: 95.3 fL (ref 78.0–100.0)
MCV: 95.6 fL (ref 78.0–100.0)
MCV: 95.8 fL (ref 78.0–100.0)
MCV: 95.9 fL (ref 78.0–100.0)
MCV: 96 fL (ref 78.0–100.0)
MCV: 96.4 fL (ref 78.0–100.0)
MCV: 96.7 fL (ref 78.0–100.0)
Platelets: 134 10*3/uL — ABNORMAL LOW (ref 150–400)
Platelets: 148 K/uL — ABNORMAL LOW (ref 150–400)
Platelets: 256 10*3/uL (ref 150–400)
Platelets: 310 10*3/uL (ref 150–400)
Platelets: 476 K/uL — ABNORMAL HIGH (ref 150–400)
Platelets: 540 K/uL — ABNORMAL HIGH (ref 150–400)
RBC: 2.16 MIL/uL — ABNORMAL LOW (ref 4.22–5.81)
RBC: 2.36 MIL/uL — ABNORMAL LOW (ref 4.22–5.81)
RBC: 2.4 MIL/uL — ABNORMAL LOW (ref 4.22–5.81)
RBC: 2.51 MIL/uL — ABNORMAL LOW (ref 4.22–5.81)
RBC: 2.63 MIL/uL — ABNORMAL LOW (ref 4.22–5.81)
RBC: 3.94 MIL/uL — ABNORMAL LOW (ref 4.22–5.81)
RDW: 13.6 % (ref 11.5–15.5)
RDW: 13.9 % (ref 11.5–15.5)
RDW: 14 % (ref 11.5–15.5)
RDW: 14.2 % (ref 11.5–15.5)
RDW: 14.3 % (ref 11.5–15.5)
RDW: 14.5 % (ref 11.5–15.5)
RDW: 14.8 % (ref 11.5–15.5)
RDW: 15 % (ref 11.5–15.5)
WBC: 13.1 10*3/uL — ABNORMAL HIGH (ref 4.0–10.5)
WBC: 13.3 10*3/uL — ABNORMAL HIGH (ref 4.0–10.5)
WBC: 13.8 10*3/uL — ABNORMAL HIGH (ref 4.0–10.5)
WBC: 18 K/uL — ABNORMAL HIGH (ref 4.0–10.5)
WBC: 21.4 K/uL — ABNORMAL HIGH (ref 4.0–10.5)
WBC: 22.2 10*3/uL — ABNORMAL HIGH (ref 4.0–10.5)
WBC: 22.5 10*3/uL — ABNORMAL HIGH (ref 4.0–10.5)
WBC: 22.9 10*3/uL — ABNORMAL HIGH (ref 4.0–10.5)
WBC: 26.2 K/uL — ABNORMAL HIGH (ref 4.0–10.5)

## 2010-03-19 LAB — COMPREHENSIVE METABOLIC PANEL
ALT: 17 U/L (ref 0–53)
ALT: 23 U/L (ref 0–53)
AST: 28 U/L (ref 0–37)
AST: 38 U/L — ABNORMAL HIGH (ref 0–37)
Albumin: 1.8 g/dL — ABNORMAL LOW (ref 3.5–5.2)
Albumin: 2.1 g/dL — ABNORMAL LOW (ref 3.5–5.2)
Alkaline Phosphatase: 54 U/L (ref 39–117)
BUN: 10 mg/dL (ref 6–23)
CO2: 20 meq/L (ref 19–32)
Calcium: 8 mg/dL — ABNORMAL LOW (ref 8.4–10.5)
Calcium: 8.1 mg/dL — ABNORMAL LOW (ref 8.4–10.5)
Chloride: 106 meq/L (ref 96–112)
Creatinine, Ser: 0.85 mg/dL (ref 0.4–1.5)
Creatinine, Ser: 1.58 mg/dL — ABNORMAL HIGH (ref 0.4–1.5)
GFR calc Af Amer: 55 mL/min — ABNORMAL LOW (ref >60)
GFR calc Af Amer: >60 mL/min (ref >60)
GFR calc non Af Amer: 46 mL/min — ABNORMAL LOW (ref >60)
Potassium: 3.6 mEq/L (ref 3.5–5.1)
Sodium: 137 mEq/L (ref 135–145)
Total Bilirubin: 0.4 mg/dL (ref 0.3–1.2)
Total Protein: 4.9 g/dL — ABNORMAL LOW (ref 6.0–8.3)
Total Protein: 5.1 g/dL — ABNORMAL LOW (ref 6.0–8.3)

## 2010-03-19 LAB — POCT I-STAT 7, (LYTES, BLD GAS, ICA,H+H)
Acid-base deficit: 2 mmol/L (ref 0.0–2.0)
Acid-base deficit: 8 mmol/L — ABNORMAL HIGH (ref 0.0–2.0)
Bicarbonate: 23.5 meq/L (ref 20.0–24.0)
Calcium, Ion: 1.08 mmol/L — ABNORMAL LOW (ref 1.12–1.32)
HCT: 29 % — ABNORMAL LOW (ref 39.0–52.0)
Hemoglobin: 9.9 g/dL — ABNORMAL LOW (ref 13.0–17.0)
O2 Saturation: 100 %
Patient temperature: 35.3
TCO2: 20 mmol/L (ref 0–100)
pCO2 arterial: 38.5 mmHg (ref 35.0–45.0)
pO2, Arterial: 388 mmHg — ABNORMAL HIGH (ref 80.0–100.0)
pO2, Arterial: 437 mmHg — ABNORMAL HIGH (ref 80.0–100.0)

## 2010-03-19 LAB — COMPREHENSIVE METABOLIC PANEL WITH GFR
ALT: 110 U/L — ABNORMAL HIGH (ref 0–53)
AST: 56 U/L — ABNORMAL HIGH (ref 0–37)
Albumin: 1.7 g/dL — ABNORMAL LOW (ref 3.5–5.2)
Alkaline Phosphatase: 133 U/L — ABNORMAL HIGH (ref 39–117)
BUN: 10 mg/dL (ref 6–23)
CO2: 29 meq/L (ref 19–32)
Calcium: 8.1 mg/dL — ABNORMAL LOW (ref 8.4–10.5)
Chloride: 101 meq/L (ref 96–112)
Creatinine, Ser: 0.82 mg/dL (ref 0.4–1.5)
GFR calc non Af Amer: >60 mL/min
Glucose, Bld: 149 mg/dL — ABNORMAL HIGH (ref 70–99)
Potassium: 4.2 meq/L (ref 3.5–5.1)
Sodium: 135 meq/L (ref 135–145)
Total Bilirubin: 0.9 mg/dL (ref 0.3–1.2)
Total Protein: 5.3 g/dL — ABNORMAL LOW (ref 6.0–8.3)

## 2010-03-19 LAB — BLOOD GAS, ARTERIAL
Acid-Base Excess: 2.5 mmol/L — ABNORMAL HIGH (ref 0.0–2.0)
Acid-Base Excess: 3 mmol/L — ABNORMAL HIGH (ref 0.0–2.0)
Bicarbonate: 26.3 mEq/L — ABNORMAL HIGH (ref 20.0–24.0)
Bicarbonate: 26.8 mEq/L — ABNORMAL HIGH (ref 20.0–24.0)
Bicarbonate: 28.1 meq/L — ABNORMAL HIGH (ref 20.0–24.0)
Drawn by: 24487
Drawn by: 270271
FIO2: 0.6 %
MECHVT: 500 mL
MECHVT: 500 mL
O2 Saturation: 94.1 %
O2 Saturation: 95.5 %
O2 Saturation: 99 %
PEEP: 5 cmH2O
PEEP: 8 cmH2O
Patient temperature: 98.6
Patient temperature: 98.6
RATE: 15 resp/min
RATE: 15 {breaths}/min
TCO2: 29.7 mmol/L (ref 0–100)
pCO2 arterial: 51.6 mmHg — ABNORMAL HIGH (ref 35.0–45.0)
pH, Arterial: 7.355 (ref 7.350–7.450)
pO2, Arterial: 62.5 mmHg — ABNORMAL LOW (ref 80.0–100.0)
pO2, Arterial: 83.3 mmHg (ref 80.0–100.0)

## 2010-03-19 LAB — BASIC METABOLIC PANEL WITH GFR
BUN: 10 mg/dL (ref 6–23)
BUN: 10 mg/dL (ref 6–23)
CO2: 26 meq/L (ref 19–32)
CO2: 27 meq/L (ref 19–32)
Calcium: 7.8 mg/dL — ABNORMAL LOW (ref 8.4–10.5)
Calcium: 7.9 mg/dL — ABNORMAL LOW (ref 8.4–10.5)
Chloride: 101 meq/L (ref 96–112)
Chloride: 101 meq/L (ref 96–112)
Creatinine, Ser: 0.95 mg/dL (ref 0.4–1.5)
Creatinine, Ser: 0.98 mg/dL (ref 0.4–1.5)
GFR calc non Af Amer: >60 mL/min
GFR calc non Af Amer: >60 mL/min
Glucose, Bld: 113 mg/dL — ABNORMAL HIGH (ref 70–99)
Glucose, Bld: 142 mg/dL — ABNORMAL HIGH (ref 70–99)
Potassium: 3.4 meq/L — ABNORMAL LOW (ref 3.5–5.1)
Potassium: 3.5 meq/L (ref 3.5–5.1)
Sodium: 132 meq/L — ABNORMAL LOW (ref 135–145)
Sodium: 134 meq/L — ABNORMAL LOW (ref 135–145)

## 2010-03-19 LAB — TYPE AND SCREEN: ABO/RH(D): A POS

## 2010-03-19 LAB — EXPECTORATED SPUTUM ASSESSMENT W GRAM STAIN, RFLX TO RESP C

## 2010-03-19 LAB — DIFFERENTIAL
Basophils Absolute: 0 10*3/uL (ref 0.0–0.1)
Basophils Absolute: 0 10*3/uL (ref 0.0–0.1)
Basophils Absolute: 0 K/uL (ref 0.0–0.1)
Basophils Absolute: 0.2 10*3/uL — ABNORMAL HIGH (ref 0.0–0.1)
Basophils Relative: 0 % (ref 0–1)
Basophils Relative: 1 % (ref 0–1)
Eosinophils Absolute: 0 10*3/uL (ref 0.0–0.7)
Eosinophils Absolute: 0 10*3/uL (ref 0.0–0.7)
Eosinophils Absolute: 0.2 10*3/uL (ref 0.0–0.7)
Eosinophils Absolute: 0.2 K/uL (ref 0.0–0.7)
Eosinophils Relative: 1 % (ref 0–5)
Lymphocytes Relative: 10 % — ABNORMAL LOW (ref 12–46)
Lymphocytes Relative: 7 % — ABNORMAL LOW (ref 12–46)
Lymphocytes Relative: 8 % — ABNORMAL LOW (ref 12–46)
Lymphocytes Relative: 9 % — ABNORMAL LOW (ref 12–46)
Lymphs Abs: 1.2 K/uL (ref 0.7–4.0)
Lymphs Abs: 1.8 10*3/uL (ref 0.7–4.0)
Lymphs Abs: 2.4 10*3/uL (ref 0.7–4.0)
Monocytes Absolute: 0.8 10*3/uL (ref 0.1–1.0)
Monocytes Absolute: 1.3 K/uL — ABNORMAL HIGH (ref 0.1–1.0)
Monocytes Absolute: 1.8 10*3/uL — ABNORMAL HIGH (ref 0.1–1.0)
Monocytes Relative: 10 % (ref 3–12)
Neutro Abs: 11.1 K/uL — ABNORMAL HIGH (ref 1.7–7.7)
Neutro Abs: 18.6 10*3/uL — ABNORMAL HIGH (ref 1.7–7.7)
Neutro Abs: 20.2 10*3/uL — ABNORMAL HIGH (ref 1.7–7.7)
Neutrophils Relative %: 80 % — ABNORMAL HIGH (ref 43–77)
Neutrophils Relative %: 90 % — ABNORMAL HIGH (ref 43–77)

## 2010-03-19 LAB — PHOSPHORUS: Phosphorus: 2.6 mg/dL (ref 2.3–4.6)

## 2010-03-19 LAB — URINALYSIS, ROUTINE W REFLEX MICROSCOPIC
Glucose, UA: NEGATIVE mg/dL
Hgb urine dipstick: NEGATIVE
Specific Gravity, Urine: 1.03 (ref 1.005–1.030)

## 2010-03-19 LAB — APTT: aPTT: 26 s (ref 24–37)

## 2010-03-19 LAB — URINE CULTURE
Colony Count: NO GROWTH
Colony Count: NO GROWTH
Culture: NO GROWTH
Culture: NO GROWTH

## 2010-03-19 LAB — CULTURE, BAL-QUANTITATIVE W GRAM STAIN
Colony Count: 1000
Colony Count: NO GROWTH
Culture: NO GROWTH

## 2010-03-19 LAB — RAPID URINE DRUG SCREEN, HOSP PERFORMED
Amphetamines: NOT DETECTED
Barbiturates: NOT DETECTED
Benzodiazepines: POSITIVE — AB
Opiates: NOT DETECTED

## 2010-03-19 LAB — POCT I-STAT, CHEM 8
BUN: 8 mg/dL (ref 6–23)
Calcium, Ion: 1.07 mmol/L — ABNORMAL LOW (ref 1.12–1.32)
Chloride: 106 meq/L (ref 96–112)
Glucose, Bld: 90 mg/dL (ref 70–99)

## 2010-03-19 LAB — CULTURE, RESPIRATORY W GRAM STAIN

## 2010-03-19 LAB — PROTIME-INR: INR: 1.21 (ref 0.00–1.49)

## 2010-03-19 LAB — CULTURE, BLOOD (ROUTINE X 2)
Culture: NO GROWTH
Culture: NO GROWTH
Culture: NO GROWTH

## 2010-03-19 LAB — ABO/RH: ABO/RH(D): A POS

## 2010-03-19 LAB — MRSA PCR SCREENING: MRSA by PCR: NEGATIVE

## 2010-03-19 LAB — MAGNESIUM: Magnesium: 1.7 mg/dL (ref 1.5–2.5)

## 2010-03-19 LAB — VANCOMYCIN, TROUGH: Vancomycin Tr: 12.5 ug/mL (ref 10.0–20.0)

## 2010-03-28 ENCOUNTER — Encounter: Payer: BC Managed Care – PPO | Attending: Physical Medicine & Rehabilitation

## 2010-03-28 ENCOUNTER — Ambulatory Visit: Payer: BC Managed Care – PPO | Admitting: Physical Medicine & Rehabilitation

## 2010-04-11 LAB — BASIC METABOLIC PANEL
CO2: 25 mEq/L (ref 19–32)
Calcium: 9.6 mg/dL (ref 8.4–10.5)
GFR calc Af Amer: >60 mL/min (ref >60)
Potassium: 4.3 mEq/L (ref 3.5–5.1)
Sodium: 139 mEq/L (ref 135–145)

## 2010-04-11 LAB — CBC
Hemoglobin: 12.7 g/dL — ABNORMAL LOW (ref 13.0–17.0)
MCHC: 34.3 g/dL (ref 30.0–36.0)
RBC: 4 MIL/uL — ABNORMAL LOW (ref 4.22–5.81)

## 2010-04-14 ENCOUNTER — Encounter (HOSPITAL_BASED_OUTPATIENT_CLINIC_OR_DEPARTMENT_OTHER): Payer: BC Managed Care – PPO | Attending: General Surgery

## 2010-04-14 DIAGNOSIS — L89899 Pressure ulcer of other site, unspecified stage: Secondary | ICD-10-CM | POA: Insufficient documentation

## 2010-04-14 DIAGNOSIS — L89209 Pressure ulcer of unspecified hip, unspecified stage: Secondary | ICD-10-CM | POA: Insufficient documentation

## 2010-04-14 DIAGNOSIS — L8990 Pressure ulcer of unspecified site, unspecified stage: Secondary | ICD-10-CM | POA: Insufficient documentation

## 2010-04-14 DIAGNOSIS — G8220 Paraplegia, unspecified: Secondary | ICD-10-CM | POA: Insufficient documentation

## 2010-04-14 DIAGNOSIS — L89009 Pressure ulcer of unspecified elbow, unspecified stage: Secondary | ICD-10-CM | POA: Insufficient documentation

## 2010-04-14 DIAGNOSIS — L89109 Pressure ulcer of unspecified part of back, unspecified stage: Secondary | ICD-10-CM | POA: Insufficient documentation

## 2010-04-25 ENCOUNTER — Encounter
Payer: BC Managed Care – PPO | Attending: Physical Medicine & Rehabilitation | Admitting: Physical Medicine & Rehabilitation

## 2010-04-25 DIAGNOSIS — L8990 Pressure ulcer of unspecified site, unspecified stage: Secondary | ICD-10-CM | POA: Insufficient documentation

## 2010-04-25 DIAGNOSIS — IMO0002 Reserved for concepts with insufficient information to code with codable children: Secondary | ICD-10-CM | POA: Insufficient documentation

## 2010-04-25 DIAGNOSIS — R259 Unspecified abnormal involuntary movements: Secondary | ICD-10-CM | POA: Insufficient documentation

## 2010-04-25 DIAGNOSIS — X58XXXS Exposure to other specified factors, sequela: Secondary | ICD-10-CM | POA: Insufficient documentation

## 2010-04-25 DIAGNOSIS — K592 Neurogenic bowel, not elsewhere classified: Secondary | ICD-10-CM

## 2010-04-25 DIAGNOSIS — G808 Other cerebral palsy: Secondary | ICD-10-CM

## 2010-04-25 DIAGNOSIS — G8254 Quadriplegia, C5-C7 incomplete: Secondary | ICD-10-CM | POA: Insufficient documentation

## 2010-04-25 DIAGNOSIS — N319 Neuromuscular dysfunction of bladder, unspecified: Secondary | ICD-10-CM | POA: Insufficient documentation

## 2010-04-25 DIAGNOSIS — M7500 Adhesive capsulitis of unspecified shoulder: Secondary | ICD-10-CM | POA: Insufficient documentation

## 2010-04-25 DIAGNOSIS — S14101A Unspecified injury at C1 level of cervical spinal cord, initial encounter: Secondary | ICD-10-CM

## 2010-04-25 NOTE — Assessment & Plan Note (Signed)
[name] is back regarding his spinal cord injury.  Pain in general is better.  The Neurontin felt some of the shooting pains.  We had to switch to OxyContin due to unavailability of the Opana, this has caused some sedation and patient's wife want him to switch back.  He has occasional spasms in the lower extremities.  His wounds are appearing better.  His weight is stable.  Blood pressure is bit lower lately per wife.  He maintains on baclofen at the current dosing.  REVIEW OF SYSTEMS:  Notable for the above.  He does report occasional spasms when transferring or moving.  Full 12-point review is in the written health and history section of the chart.  SOCIAL HISTORY:  The patient is married, wife is with him once again today.  PHYSICAL EXAMINATION:  VITAL SIGNS:  Blood pressure is 83/33, pulse is 80, respiratory rate 18, he is satting 98% on room air. GENERAL:  The patient is generally pleasant, but appears lethargic and sleepy. MUSCULOSKELETAL:  He has tightness particularly in the right biceps and brachioradialis, as well as bilateral pec major and minor muscles and left wrist finger flexors.  Tone in the range of 2-3/4 on the right and 2/4 on the left.  Teres major muscles may be involved as well.  Sacral and lower extremity wounds were not visualized as these were addressed and I left these alone today.  He has 1/2 touch below the level of his injury.  He has actually very good passive range of motion in both legs today.  He did have some clonus and excitable reflexes. HEART:  Regular. CHEST:  Clear. ABDOMEN:  Soft, nontender.  ASSESSMENT: 1. 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 2. Diffuse spasticity upper greater than lower. 3. Bilateral adhesive capsulitis of the shoulders. 4. Multiple pressure wounds. 5. Neurogenic bowel and bladder.  PLAN: 1. We will change him back to Opana ER 20 mg q.12 hours as he was     doing quite well with this per  wife. 2. Refill oxycodone 5 mg one q.6 hours p.r.n. #90. 3. He will stay with Neurontin for neuropathic pain, spasms, as well     as his current baclofen dosing. 4. We will set him up for Botox injections to the right biceps,     brachii, and brachioradialis, left FDP, FDS, and flexor carpi     muscles as well.  Consider bilateral pec major/minor injections     also. 5. Sedation is likely due to his OxyContin as wife noticed a change     when he switched to this.  We will observe for improvement off the     OxyContin.  If no further change is seen, we will need look at     workup including Infectious Disease, electrolytes, blood counts,     etc. 6. I will see him back for injections as above.  Wife is asked to call     me with any problems or concerns.     Ranelle Oyster, M.D. Electronically Signed    ZTS/MedQ D:  04/25/2010 11:22:57  T:  04/25/2010 23:44:07  Job #:  [id number]

## 2010-05-05 ENCOUNTER — Encounter (HOSPITAL_BASED_OUTPATIENT_CLINIC_OR_DEPARTMENT_OTHER): Payer: BC Managed Care – PPO | Attending: General Surgery

## 2010-05-05 DIAGNOSIS — L89009 Pressure ulcer of unspecified elbow, unspecified stage: Secondary | ICD-10-CM | POA: Insufficient documentation

## 2010-05-05 DIAGNOSIS — L89209 Pressure ulcer of unspecified hip, unspecified stage: Secondary | ICD-10-CM | POA: Insufficient documentation

## 2010-05-05 DIAGNOSIS — L8990 Pressure ulcer of unspecified site, unspecified stage: Secondary | ICD-10-CM | POA: Insufficient documentation

## 2010-05-05 DIAGNOSIS — L8993 Pressure ulcer of unspecified site, stage 3: Secondary | ICD-10-CM | POA: Insufficient documentation

## 2010-05-05 DIAGNOSIS — L89609 Pressure ulcer of unspecified heel, unspecified stage: Secondary | ICD-10-CM | POA: Insufficient documentation

## 2010-05-05 DIAGNOSIS — L89109 Pressure ulcer of unspecified part of back, unspecified stage: Secondary | ICD-10-CM | POA: Insufficient documentation

## 2010-05-05 DIAGNOSIS — Z79899 Other long term (current) drug therapy: Secondary | ICD-10-CM | POA: Insufficient documentation

## 2010-05-16 NOTE — Op Note (Signed)
NAMEDEWEL, LOTTER NO.:  [id number]   MEDICAL RECORD NO.:  [id number]          PATIENT TYPE:  OIB   LOCATION:  3599                         FACILITY:  [facility]   PHYSICIAN:  Cristi Loron, M.D.DATE OF BIRTH:  Oct 26, 1955   DATE OF PROCEDURE:  05/19/2008  DATE OF DISCHARGE:                               OPERATIVE REPORT   BRIEF HISTORY:  The patient is a 55 year old black male who suffered  from back and right leg pain consistent with a right S1 radiculopathy.  He failed medical management and was worked up with lumbar MRI which  demonstrated that he had herniated disk at L5-S1 on the right.  I  discussed the various treatment options with the patient including  surgery.  He has weighed the risks, benefits, and alternatives of  surgery and decided to proceed with a right L5-S1 diskectomy.   PREOPERATIVE DIAGNOSES:  Right L5-S1 herniated nucleus pulposus, spinal  stenosis, lumbar radiculopathy, and lumbago.   POSTOPERATIVE DIAGNOSES:  Right L5-S1 herniated nucleus pulposus, spinal  stenosis, lumbar radiculopathy, and lumbago.   PROCEDURE:  Right L5-S1 diskectomy using microdissection.   SURGEON:  Cristi Loron, MD   ASSISTANT:  Danae Orleans. Venetia Maxon, MD   ANESTHESIA:  General endotracheal.   ESTIMATED BLOOD LOSS:  25 mL.   SPECIMENS:  None.   DRAINS:  None.   COMPLICATIONS:  None.   DESCRIPTION OF PROCEDURE:  The patient was brought to the operating room  by Anesthesia Team.  General endotracheal anesthesia was induced.  The  patient was turned to the prone position on the Wilson frame.  His  lumbosacral region was then prepared with Betadine scrub and Betadine  solution.  Sterile drapes were applied.  I then injected the area to be  incised with Marcaine with epinephrine solution.  I used a scalpel to  make a linear midline incision over the L5-S1 interspace.  I used  electrocautery to perform a right-sided subperiosteal dissection  exposing the  right spinous process, lamina of L5 in the upper sacrum.  We obtained intraoperative radiograph to confirm location and inserted  the [facility] retractor for exposure.   We then brought the operative microscope into the field and under its  magnification and illumination, we completed the  microdissection/decompression.  I used a high-speed drill to perform a  right L5 laminotomy.  I widened the laminotomy with Kerrison punch  removing the right L5-S1 ligamentum flavum.  We then used  microdissection to free up the thecal sac and the S1 nerve root.  Dr.  Venetia Maxon then gently retracted the thecal sac and the S1 nerve root  medially with a D'Errico retractor.  This exposed the underlying disk  herniation and part of it had migrated caudally over the upper S1  vertebral body.  I incised the L5-S1 intervertebral disk and performed  partial intervertebral diskectomy using the pituitary forceps and the  Epstein curettes.  I then used osteophyte tool to remove some redundant  posterior longitudinal ligament, spondylosis from the vertebral  endplates at L5-S1 further decompressing the neural elements.  I then  palpated along the ventral surface of the thecal sac along the exit  route of the right S1 nerve root and noted the neural structure was well-  decompressed.  We obtained hemostasis using bipolar electrocautery.  We  irrigated the wound with bacitracin solution.  We then removed the  retractor and then reapproximated the patient's right thoracolumbar  fascia with interrupted #1 Vicryl suture, subcutaneous tissue with  interrupted 2-0 Vicryl suture, and the skin with Steri-Strips and  benzoin.  The wound was then coated with bacitracin ointment.  Sterile  dressing was applied.  The drapes were removed.  The patient was  subsequently returned to the supine position where he was extubated by  the Anesthesia Team and transported to the Postanesthesia Care Unit in  stable condition.  All sponge,  instrument, and needle counts were  correct at the end of the case.      Cristi Loron, M.D.  Electronically Signed     JDJ/MEDQ  D:  05/19/2008  T:  05/19/2008  Job:  [id number]

## 2010-05-19 NOTE — Discharge Summary (Signed)
NAMEZEBULIN, SIEGEL NO.:  [id number]   MEDICAL RECORD NO.:  [id number]          PATIENT TYPE:  INP   LOCATION:  3535                         FACILITY:  [facility]   PHYSICIAN:  Cristi Loron, M.D.DATE OF BIRTH:  May 14, 1955   DATE OF ADMISSION:  05/19/2008  DATE OF DISCHARGE:  05/19/2008                               DISCHARGE SUMMARY   BRIEF HISTORY:  The patient is a 55 year old black male who has suffered  from back and right leg pain consistent with a right S1 radiculopathy.  He has failed medical management and was worked up with a lumbar MRI  which demonstrated he had a herniated disk at L5-S1 on right.  I  discussed various treatment options with the patient including surgery.  The patient has weighed the risks, benefits, and alternatives of surgery  and decided to proceed with a right L5-S1 diskectomy.   For further details of this admission, please refer to typed history and  physical.   HOSPITAL COURSE:  I admitted to [facility] on May 19, 2008.  On  day of admission, I performed a right L5-S1 diskectomy.  Surgery went  well (for full details of the operation, please refer to operative of  note).   POSTOPERATIVE COURSE:  The patient's postoperative course was  unremarkable.  He was discharged home on postop day #1, i.e. May 19, 2008.   DISCHARGE INSTRUCTIONS:  The patient was given written discharge  instructions, instructed to follow up with me in 4 weeks.   FINAL DIAGNOSES:  Right L5-S1 herniated nucleus pulposus, spinal  stenosis, lumbar radiculopathy, and lumbago.   PROCEDURE PERFORMED:  Right L5-S1 diskectomy using microdissection.      Cristi Loron, M.D.  Electronically Signed     JDJ/MEDQ  D:  06/17/2008  T:  06/18/2008  Job:  [id number]

## 2010-06-06 ENCOUNTER — Encounter: Payer: Medicare Other | Attending: Physical Medicine & Rehabilitation | Admitting: Physical Medicine & Rehabilitation

## 2010-06-06 DIAGNOSIS — S14101A Unspecified injury at C1 level of cervical spinal cord, initial encounter: Secondary | ICD-10-CM

## 2010-06-06 DIAGNOSIS — M7530 Calcific tendinitis of unspecified shoulder: Secondary | ICD-10-CM

## 2010-06-06 DIAGNOSIS — G8254 Quadriplegia, C5-C7 incomplete: Secondary | ICD-10-CM | POA: Insufficient documentation

## 2010-06-06 DIAGNOSIS — L989 Disorder of the skin and subcutaneous tissue, unspecified: Secondary | ICD-10-CM | POA: Insufficient documentation

## 2010-06-06 DIAGNOSIS — X58XXXS Exposure to other specified factors, sequela: Secondary | ICD-10-CM | POA: Insufficient documentation

## 2010-06-06 DIAGNOSIS — IMO0002 Reserved for concepts with insufficient information to code with codable children: Secondary | ICD-10-CM | POA: Insufficient documentation

## 2010-06-06 DIAGNOSIS — L8990 Pressure ulcer of unspecified site, unspecified stage: Secondary | ICD-10-CM

## 2010-06-06 DIAGNOSIS — N319 Neuromuscular dysfunction of bladder, unspecified: Secondary | ICD-10-CM | POA: Insufficient documentation

## 2010-06-06 DIAGNOSIS — K592 Neurogenic bowel, not elsewhere classified: Secondary | ICD-10-CM | POA: Insufficient documentation

## 2010-06-06 DIAGNOSIS — G8110 Spastic hemiplegia affecting unspecified side: Secondary | ICD-10-CM

## 2010-06-06 DIAGNOSIS — M7500 Adhesive capsulitis of unspecified shoulder: Secondary | ICD-10-CM | POA: Insufficient documentation

## 2010-06-06 NOTE — Assessment & Plan Note (Signed)
Jailyn is back regarding his spinal cord injury.  He saw Dr. Jeral Fruit, recently who wondered if he might benefit from orthopedic evaluation of the shoulders.  We had ordered Botox in the last visit and apparently, he has got Medicare and is approved for this now.  Shoulder is still causing pain.  He is on Opana and oxycodone for symptoms.  I think overall pain is a bit better, but still a big problem.  He wants to get back into physical therapy.  REVIEW OF SYSTEMS:  Notable for spasms, tingling, weakness.  Full 12- point review is in the written health and history section of the chart.  SOCIAL HISTORY:  The patient is married.  Wife is with him today.  PHYSICAL EXAMINATION:  VITAL SIGNS:  Blood pressure is 73/36, pulse 74, respiratory rate 18, satting 98% on room air. GENERAL:  The patient is flat, sitting in chair, in no acute distress. Weight is unchanged. MUSCULOSKELETAL:  Shoulders are both tight especially, right shoulder today.  He has tightness in both pec major minor muscles, as well as teres major and latissimus dorsi in the right.  Left side similar minus latissimus dorsi muscle.  The biceps brachii and brachioradialis are also tight on the right side.  A great tone in the shoulders, right shoulder at 2-3/4, elbow 2/4, left shoulder 2/4.  Motor and sensory exam are unchanged. HEART:  Regular. CHEST:  Clear. ABDOMEN:  Soft, nontender.  ASSESSMENT: 1. 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 2. Bilateral adhesive capsulitis of the shoulders. 3. Neurogenic bowel and bladder. 4. Multiple wounds.  PLAN: 1. After informed consent, we injected the right pec major and minor     with 100 units of Botox.  The teres major and latissimus dorsi in     the right with 100 units of Botox total, biceps brachii and     brachioradialis muscles with 100 units of Botox in the left pec     major and minor as well as part of the teres major  with a total of     100 units Botox.  Each 100 units was diluted in 1 mL preservative-     free normal saline.  Diagnostic code for these injections were     342.11, 342.12. 2. Initially, we injected the bilateral shoulders with 40 mg Kenalog     and 3 mL of 1% lidocaine.  The patient tolerated well. 3. Refill Opana and oxycodone. 4. Send patient for outpatient Physical and Occupational Therapy, work     on range of motion and mobility. 5. Consider Orthopedic consult for shoulder manipulation.     Ranelle Oyster, M.D. Electronically Signed    ZTS/MedQ D:  06/06/2010 11:17:06  T:  06/06/2010 23:21:26  Job #:  [id number]

## 2010-06-16 ENCOUNTER — Encounter (HOSPITAL_BASED_OUTPATIENT_CLINIC_OR_DEPARTMENT_OTHER): Payer: Medicare Other | Attending: General Surgery

## 2010-06-16 DIAGNOSIS — L8990 Pressure ulcer of unspecified site, unspecified stage: Secondary | ICD-10-CM | POA: Insufficient documentation

## 2010-06-16 DIAGNOSIS — Z79899 Other long term (current) drug therapy: Secondary | ICD-10-CM | POA: Insufficient documentation

## 2010-06-16 DIAGNOSIS — L89109 Pressure ulcer of unspecified part of back, unspecified stage: Secondary | ICD-10-CM | POA: Insufficient documentation

## 2010-06-16 DIAGNOSIS — L89009 Pressure ulcer of unspecified elbow, unspecified stage: Secondary | ICD-10-CM | POA: Insufficient documentation

## 2010-06-16 DIAGNOSIS — L8993 Pressure ulcer of unspecified site, stage 3: Secondary | ICD-10-CM | POA: Insufficient documentation

## 2010-06-16 DIAGNOSIS — L89209 Pressure ulcer of unspecified hip, unspecified stage: Secondary | ICD-10-CM | POA: Insufficient documentation

## 2010-06-16 DIAGNOSIS — L89609 Pressure ulcer of unspecified heel, unspecified stage: Secondary | ICD-10-CM | POA: Insufficient documentation

## 2010-06-27 ENCOUNTER — Ambulatory Visit: Payer: Medicare Other | Admitting: Physical Therapy

## 2010-06-27 ENCOUNTER — Ambulatory Visit: Payer: Medicare Other | Admitting: Occupational Therapy

## 2010-07-12 ENCOUNTER — Encounter: Payer: Medicare Other | Attending: Physical Medicine & Rehabilitation | Admitting: Physical Medicine & Rehabilitation

## 2010-07-12 DIAGNOSIS — N319 Neuromuscular dysfunction of bladder, unspecified: Secondary | ICD-10-CM | POA: Insufficient documentation

## 2010-07-12 DIAGNOSIS — X58XXXA Exposure to other specified factors, initial encounter: Secondary | ICD-10-CM | POA: Insufficient documentation

## 2010-07-12 DIAGNOSIS — K592 Neurogenic bowel, not elsewhere classified: Secondary | ICD-10-CM | POA: Insufficient documentation

## 2010-07-12 DIAGNOSIS — S14105A Unspecified injury at C5 level of cervical spinal cord, initial encounter: Secondary | ICD-10-CM | POA: Insufficient documentation

## 2010-07-12 DIAGNOSIS — M7500 Adhesive capsulitis of unspecified shoulder: Secondary | ICD-10-CM | POA: Insufficient documentation

## 2010-07-12 DIAGNOSIS — G8254 Quadriplegia, C5-C7 incomplete: Secondary | ICD-10-CM | POA: Insufficient documentation

## 2010-07-12 DIAGNOSIS — G808 Other cerebral palsy: Secondary | ICD-10-CM

## 2010-07-12 DIAGNOSIS — IMO0002 Reserved for concepts with insufficient information to code with codable children: Secondary | ICD-10-CM | POA: Insufficient documentation

## 2010-07-12 DIAGNOSIS — M7530 Calcific tendinitis of unspecified shoulder: Secondary | ICD-10-CM

## 2010-07-12 DIAGNOSIS — S14101A Unspecified injury at C1 level of cervical spinal cord, initial encounter: Secondary | ICD-10-CM

## 2010-07-12 NOTE — Assessment & Plan Note (Signed)
HISTORY:  [name] is back regarding his spinal cord injury and spasticity.  We injected his right biceps teres major latissimus dorsi, pec major and minor at last visit as well as his biceps brachii and brachioradialis muscles.  I used total 400 units of Botox.  He tolerated these injections quite well.  The patient is improved his range of motion in therapy and pain is gone down as well.  Even being off his Opana which they could not require due to insurance and cost reasons. Pain today is about 6/10.  Wounds are healing.  He still has small spot on the left heels as well as the sacral region but these are improving. A small spot in the right elbow which is essentially a scab now.  The patient is not doing much the way of exercise on his own at home.  His wife does help him with range of motion at home.  The patient does report some constipation at times.  REVIEW OF SYSTEMS:  Full review is in the written health and history section, other pertinent positives above.  SOCIAL HISTORY:  Unchanged.  The patient is married and wife is with him today.  PHYSICAL EXAMINATION:  VITAL SIGNS:  Blood pressure 92/54, pulse 82, respiratory rate 18,  97% on room air. GENERAL:  The patient is pleasant and alert. MUSCULOSKELETAL:  He has better range of motion in both shoulders particularly at the right side.  Able to usually move the right shoulder to 90 degrees with some resistance still noted.  His pain tolerance as much better.  Tone at the shoulder is 2/4.  His elbow biceps is 1-2/4, hand intrinsics are 1-2/4, and left shoulder is 1-2/4.  Motor and sensory exam are essentially unchanged.  Sitting posture is fair.  He began with complaints of neck pain while in the office today which was relieved with some positional changes in his chair.  I did not remove his wound dressings. HEART:  Regular. CHEST:  Clear. ABDOMEN:  Soft, nontender.  It remains slight of build.  ASSESSMENT: 1. C5-C6 spinal  cord injury with incomplete tetraplegia by spasticity. 2. Bilateral adhesive capsulitis in the shoulders. 3. Neurogenic bowel and bladder. 4. Multiple pressure wounds.  PLAN: 1. The patient has done well with the Botox and therapy.  I going to     be in favor continue with his course.  We had a repeat Botox in     another month or so.  He needs is to work on range of motion at     home throughout the day and on his off day from therapy. 2. We will refill Opana ER which he now is able to get filled.  Hope     this helps with some of his activity and therapy tolerance.  Use     oxycodone for breakthrough symptoms. 3. I would like to increase his baclofen up to 20 mg q.6 h. which we     will do over the next week.  Wife will call     with any problems or questions on that. 4. I will see him back here in about a month.     Ranelle Oyster, M.D. Electronically Signed    ZTS/MedQ D:  07/12/2010 14:55:13  T:  07/12/2010 16:30:33  Job #:  [id number]  cc:   Hilda Lias, M.D. Fax: [phone]

## 2010-07-14 ENCOUNTER — Encounter (HOSPITAL_BASED_OUTPATIENT_CLINIC_OR_DEPARTMENT_OTHER): Payer: Medicare HMO | Attending: General Surgery

## 2010-07-14 DIAGNOSIS — L89609 Pressure ulcer of unspecified heel, unspecified stage: Secondary | ICD-10-CM | POA: Insufficient documentation

## 2010-07-14 DIAGNOSIS — L89109 Pressure ulcer of unspecified part of back, unspecified stage: Secondary | ICD-10-CM | POA: Insufficient documentation

## 2010-07-14 DIAGNOSIS — L8993 Pressure ulcer of unspecified site, stage 3: Secondary | ICD-10-CM | POA: Insufficient documentation

## 2010-07-14 DIAGNOSIS — L89209 Pressure ulcer of unspecified hip, unspecified stage: Secondary | ICD-10-CM | POA: Insufficient documentation

## 2010-07-14 DIAGNOSIS — L8992 Pressure ulcer of unspecified site, stage 2: Secondary | ICD-10-CM | POA: Insufficient documentation

## 2010-07-14 DIAGNOSIS — L89009 Pressure ulcer of unspecified elbow, unspecified stage: Secondary | ICD-10-CM | POA: Insufficient documentation

## 2010-07-14 DIAGNOSIS — L89899 Pressure ulcer of other site, unspecified stage: Secondary | ICD-10-CM | POA: Insufficient documentation

## 2010-08-04 ENCOUNTER — Encounter (HOSPITAL_BASED_OUTPATIENT_CLINIC_OR_DEPARTMENT_OTHER): Payer: Medicare HMO | Attending: General Surgery

## 2010-08-04 DIAGNOSIS — L89009 Pressure ulcer of unspecified elbow, unspecified stage: Secondary | ICD-10-CM | POA: Insufficient documentation

## 2010-08-04 DIAGNOSIS — L8992 Pressure ulcer of unspecified site, stage 2: Secondary | ICD-10-CM | POA: Insufficient documentation

## 2010-08-04 DIAGNOSIS — L89899 Pressure ulcer of other site, unspecified stage: Secondary | ICD-10-CM | POA: Insufficient documentation

## 2010-08-04 DIAGNOSIS — L89109 Pressure ulcer of unspecified part of back, unspecified stage: Secondary | ICD-10-CM | POA: Insufficient documentation

## 2010-08-04 DIAGNOSIS — L89609 Pressure ulcer of unspecified heel, unspecified stage: Secondary | ICD-10-CM | POA: Insufficient documentation

## 2010-08-04 DIAGNOSIS — L89209 Pressure ulcer of unspecified hip, unspecified stage: Secondary | ICD-10-CM | POA: Insufficient documentation

## 2010-08-04 DIAGNOSIS — L8993 Pressure ulcer of unspecified site, stage 3: Secondary | ICD-10-CM | POA: Insufficient documentation

## 2010-08-07 ENCOUNTER — Encounter: Payer: Medicare HMO | Attending: Neurosurgery | Admitting: Neurosurgery

## 2010-08-07 DIAGNOSIS — K592 Neurogenic bowel, not elsewhere classified: Secondary | ICD-10-CM | POA: Insufficient documentation

## 2010-08-07 DIAGNOSIS — X58XXXS Exposure to other specified factors, sequela: Secondary | ICD-10-CM | POA: Insufficient documentation

## 2010-08-07 DIAGNOSIS — M25529 Pain in unspecified elbow: Secondary | ICD-10-CM

## 2010-08-07 DIAGNOSIS — M62838 Other muscle spasm: Secondary | ICD-10-CM | POA: Insufficient documentation

## 2010-08-07 DIAGNOSIS — M7500 Adhesive capsulitis of unspecified shoulder: Secondary | ICD-10-CM | POA: Insufficient documentation

## 2010-08-07 DIAGNOSIS — N319 Neuromuscular dysfunction of bladder, unspecified: Secondary | ICD-10-CM

## 2010-08-07 DIAGNOSIS — G8254 Quadriplegia, C5-C7 incomplete: Secondary | ICD-10-CM | POA: Insufficient documentation

## 2010-08-07 DIAGNOSIS — IMO0002 Reserved for concepts with insufficient information to code with codable children: Secondary | ICD-10-CM | POA: Insufficient documentation

## 2010-08-08 NOTE — Assessment & Plan Note (Signed)
[name] is a patient of Dr. Riley Kill seen for C5-6 incomplete spinal cord injury with tetraplegia and spasticity.  He does have some open wounds on his lower extremities, feet and his right elbow that is being taken care of by the [facility] as well as home wound nurse.  His wife reports no change in his condition.  He is not eating very much, but she is working on Licensed conveyancer of his at home and she states he does usually at least 1-2 good meals a day but he still tending to lose some weight, but he seems rather happy today.  His average pain is 5/6, sharp stabbing, tingling-type pain.  His activity level is 6-7.  He is seen in a wheelchair today.  Sleep patterns are good.  Most activities aggravate.  Therapy and medication tend to help.  He uses a wheelchair for transfers and for mobility, he is on disability.  REVIEW OF SYSTEMS:  Notable for some weight loss, trouble with walking, and spasms.  No suicidal thoughts or aberrant behaviors.  PAST MEDICAL HISTORY:  Unchanged.  SOCIAL HISTORY:  He lives with his wife.  He is married.  FAMILY HISTORY:  Unchanged.  PHYSICAL EXAMINATION:  VITAL SIGNS:  His blood pressure is 82/47, pulse 73, respirations 16, and O2 saturation is 99 on room air. NEUROLOGIC:  His quad strength is about 4/5.  In lower extremities, he does give way.  He is quite frail.  Constitutionally, he is very thin. He is alert and oriented today.  He is in a wheelchair for his transportation.  ASSESSMENT: 1. C5-6 spinal cord injury with incomplete tetraplegia and spasticity. 2. Adhesive capsulitis bilaterally in the shoulders. 3. Neurogenic bowel and bladder. 4. Multiple pressure wounds, cared for by home health and [facility].  PLAN: 1. They will see Dr. Riley Kill next month for possible Botox injections. 2. Opana ER 20 mg one p.o. q.12 h, 60 with no refill. 3. Oxycodone 5 mg 1 p.o. q.6 h p.r.n., 90 with no refill and he will     stay on his  baclofen 20 mg every 6 hours.  He has refills.  Their     questions were encouraged and answered.  If he needs anything, he     will let us know.     [name] L. Blima Dessert Electronically Signed    RLW/MedQ D:  08/07/2010 14:10:32  T:  08/08/2010 02:15:04  Job #:  [id number]

## 2010-09-05 ENCOUNTER — Encounter: Payer: Medicare HMO | Attending: Physical Medicine & Rehabilitation | Admitting: Physical Medicine & Rehabilitation

## 2010-09-05 DIAGNOSIS — M25519 Pain in unspecified shoulder: Secondary | ICD-10-CM | POA: Insufficient documentation

## 2010-09-05 DIAGNOSIS — N319 Neuromuscular dysfunction of bladder, unspecified: Secondary | ICD-10-CM | POA: Insufficient documentation

## 2010-09-05 DIAGNOSIS — L8990 Pressure ulcer of unspecified site, unspecified stage: Secondary | ICD-10-CM | POA: Insufficient documentation

## 2010-09-05 DIAGNOSIS — K592 Neurogenic bowel, not elsewhere classified: Secondary | ICD-10-CM

## 2010-09-05 DIAGNOSIS — S14101A Unspecified injury at C1 level of cervical spinal cord, initial encounter: Secondary | ICD-10-CM

## 2010-09-05 DIAGNOSIS — L89899 Pressure ulcer of other site, unspecified stage: Secondary | ICD-10-CM | POA: Insufficient documentation

## 2010-09-05 DIAGNOSIS — M79609 Pain in unspecified limb: Secondary | ICD-10-CM | POA: Insufficient documentation

## 2010-09-05 DIAGNOSIS — S14155A Other incomplete lesion at C5 level of cervical spinal cord, initial encounter: Secondary | ICD-10-CM | POA: Insufficient documentation

## 2010-09-05 DIAGNOSIS — G8110 Spastic hemiplegia affecting unspecified side: Secondary | ICD-10-CM

## 2010-09-05 DIAGNOSIS — M542 Cervicalgia: Secondary | ICD-10-CM | POA: Insufficient documentation

## 2010-09-05 DIAGNOSIS — M7500 Adhesive capsulitis of unspecified shoulder: Secondary | ICD-10-CM | POA: Insufficient documentation

## 2010-09-05 DIAGNOSIS — X58XXXA Exposure to other specified factors, initial encounter: Secondary | ICD-10-CM | POA: Insufficient documentation

## 2010-09-05 NOTE — Assessment & Plan Note (Signed)
[name] is back regarding a C5-C6 incomplete spinal cord injury.  He is finally getting his bed switch to an air mattress.  Apparently, a sand cushion that he had was causing some further wound issues.  We discussed his bowels today and he is only having bowel movement once a week and he is only using the MiraLax as a rescue medication.  Pain overall showing some improvement and he still has pain in the neck, shoulder regions as well as both arms.  He had good result to the last set of Botox we performed.  Today's pain is 4-5/10.  REVIEW OF SYSTEMS:  Notable for the above.  He does have some poor appetite, usually associated with his constipation as well as weight loss.  Full 12-point review is in the written health and history section in the chart.  SOCIAL HISTORY:  The patient is married.  No further changes are noted there today.  PHYSICAL EXAMINATION:  Blood pressure 91/58, pulse 101, respiratory rate 16, satting 96% on room air.  The patient is pleasant, alert and oriented x3.  He continues to have tightness in left upper extremity particularly in the left wrist and finger flexors, elbow, pec major, minor and latissimus dorsi muscles.  He might have some ulnar weakness and left arm superimposed.  Right upper extremity is grossly 2-3/5, left upper extremity is 1-2/5.  ASSESSMENT: 1. C5-C6 incomplete spinal cord injury. 2. Adhesive capsulitis, bilateral shoulders. 3. Neurogenic bowel and bladder. 4. Multiple [facility].  PLAN: 1. We will set the patient up for Botox injections totally 4 units to     the FCU, FCR, FDP, FDS, biceps brachii, pectoralis major, minor and     latissimus dorsi. 2. Refilled an Opana ER 20 mg, #60 and oxycodone 5 mg #90 without     refills. 3. I will see the patient back pending injections above.  We did     discuss at length bowel program, he needs to start with his MiraLax     every night initially, then weaned  to a schedule where he is having     every other day or daily bowel habits.  Recommend using digital     stem or suppositories to help improve the timing.     Ranelle Oyster, M.D. Electronically Signed    ZTS/MedQ D:  09/05/2010 09:43:43  T:  09/05/2010 10:03:43  Job #:  [id number]

## 2010-09-15 ENCOUNTER — Encounter (HOSPITAL_BASED_OUTPATIENT_CLINIC_OR_DEPARTMENT_OTHER): Payer: Medicare HMO | Attending: General Surgery

## 2010-09-15 DIAGNOSIS — L89009 Pressure ulcer of unspecified elbow, unspecified stage: Secondary | ICD-10-CM | POA: Insufficient documentation

## 2010-09-15 DIAGNOSIS — Z79899 Other long term (current) drug therapy: Secondary | ICD-10-CM | POA: Insufficient documentation

## 2010-09-15 DIAGNOSIS — L8990 Pressure ulcer of unspecified site, unspecified stage: Secondary | ICD-10-CM | POA: Insufficient documentation

## 2010-09-15 DIAGNOSIS — L89309 Pressure ulcer of unspecified buttock, unspecified stage: Secondary | ICD-10-CM | POA: Insufficient documentation

## 2010-09-15 DIAGNOSIS — L89899 Pressure ulcer of other site, unspecified stage: Secondary | ICD-10-CM | POA: Insufficient documentation

## 2010-09-15 DIAGNOSIS — L89109 Pressure ulcer of unspecified part of back, unspecified stage: Secondary | ICD-10-CM | POA: Insufficient documentation

## 2010-10-03 ENCOUNTER — Encounter: Payer: Medicare HMO | Attending: Physical Medicine & Rehabilitation | Admitting: Physical Medicine & Rehabilitation

## 2010-10-03 DIAGNOSIS — G8110 Spastic hemiplegia affecting unspecified side: Secondary | ICD-10-CM | POA: Insufficient documentation

## 2010-10-03 NOTE — Assessment & Plan Note (Signed)
PROCEDURE:  Botox injection.  DIAGNOSTIC CODE:  342.12 and 342.11, spastic hemiparesis each upper limb.  DESCRIPTION OF PROCEDURE:  After informed consent, preparation of skin with isopropyl alcohol.  I am injected the FCR FCU, FDP, FDS, and pronator teres muscles each forearm.  I used 200 units spread amongst each of these areas on both upper extremities.  Each of 100 units of Botox was diluted in 1 mL preservative-free normal saline.  We had good muscle reaction in the left upper extremity and not as vigorous reaction to right upper extremity due to muscle wasting.  The patient tolerated well with minimal bleeding.  Areas were prepped and dressed afterwards. Informed consent was acquired before procedure and time-out was performed before procedure as well.  I refilled the patient's Opana ER 20 mg q.12 h. which he needs to take twice daily.  He has not been doing this consistently.  We used the oxycodone for breakthrough pain.  We discussed stretching and range of motion.  When he is eligible we will look towards restarting physical therapy, again occupational therapy at home.     Ranelle Oyster, M.D. Electronically Signed    ZTS/MedQ D:  10/03/2010 11:21:02  T:  10/03/2010 13:18:19  Job #:  [id number]

## 2010-10-13 ENCOUNTER — Encounter (HOSPITAL_BASED_OUTPATIENT_CLINIC_OR_DEPARTMENT_OTHER): Payer: Medicare HMO | Attending: General Surgery

## 2010-10-13 DIAGNOSIS — L8990 Pressure ulcer of unspecified site, unspecified stage: Secondary | ICD-10-CM | POA: Insufficient documentation

## 2010-10-13 DIAGNOSIS — L89309 Pressure ulcer of unspecified buttock, unspecified stage: Secondary | ICD-10-CM | POA: Insufficient documentation

## 2010-10-13 DIAGNOSIS — L89109 Pressure ulcer of unspecified part of back, unspecified stage: Secondary | ICD-10-CM | POA: Insufficient documentation

## 2010-10-13 DIAGNOSIS — L89009 Pressure ulcer of unspecified elbow, unspecified stage: Secondary | ICD-10-CM | POA: Insufficient documentation

## 2010-10-13 DIAGNOSIS — L89899 Pressure ulcer of other site, unspecified stage: Secondary | ICD-10-CM | POA: Insufficient documentation

## 2010-10-13 DIAGNOSIS — Z79899 Other long term (current) drug therapy: Secondary | ICD-10-CM | POA: Insufficient documentation

## 2010-10-24 NOTE — H&P (Signed)
  [name], [name] NO.:  [id number]  MEDICAL RECORD NO.:  [id number]          PATIENT TYPE:  REC  LOCATION:  FOOT                         FACILITY:  [facility]  PHYSICIAN:  Joanne Gavel, M.D.        DATE OF BIRTH:  14-May-1955  DATE OF ADMISSION:  10/27/2009 DATE OF DISCHARGE:                             HISTORY & PHYSICAL   CHIEF COMPLAINT:  Multiple decubitus ulcers.  HISTORY OF PRESENT ILLNESS:  This 55 year old male was a chronic alcohol abuser.  He fell in July and sustained a spinal injury.  He has had several back surgeries including fusions and has now a rather high paraparesis.  He is not completely paralyzed or completely without sensation, but he is basically bedridden.  During his prolonged hospitalization and rehab, he has developed several ulcers.  These have been treated by his wife at home.  PAST MEDICAL HISTORY:  Essentially negative.  PAST SURGICAL HISTORY:  Back surgery.  Cigarettes none now, alcohol none now, but a history of use of both.  ALLERGIES:  None.  MEDICATIONS:  Gabapentin, tramadol, Phenergan, famotidine, pantoprazole, temazepam, megestrol, oxycodone, fentanyl patch.  REVIEW OF SYSTEMS:  As above.  He did have urosepsis while in hospital.  PHYSICAL EXAMINATION:  VITAL SIGNS:  Temperature 97.9, pulse 86, respirations 16, blood pressure 97/68. GENERAL APPEARANCE:  A very thin waisted male in no distress.  He has severe quadriparesis. ABDOMEN:  Benign. CHEST:  Clear. HEART:  Regular rhythm. EXTREMITIES:  He has good peripheral pulses.  He has on his sacrum a 10- 4 x 4.4 very shaggy decubitus.  He has on the anterior foot on the left side a 2.5 x 1.5 superficial wound.  On the right heel, he has a 0.6 x 0.4 decubitus and in left heel a 2.6 x 3.2 decubitus.  TREATMENT IN CLINIC:  The left heel decubitus is covered with a thick eschar, which is excised with no difficulty.  The sacrum however is too badly damaged and has too  much necrotic tissue to be debrided in the clinic.  PLAN:  Treat wounds with Santyl.  Get General Surgery consultation for OR debridement of sacral wound so we can prepare him for VAC.     Joanne Gavel, M.D.     RA/MEDQ  D:  10/28/2009  T:  10/29/2009  Job:  [id number]  Electronically Signed by Joanne Gavel M.D. on 10/24/2010 08:47:01 AM

## 2010-11-14 ENCOUNTER — Encounter: Payer: Medicare HMO | Attending: Physical Medicine & Rehabilitation | Admitting: Physical Medicine & Rehabilitation

## 2010-11-14 DIAGNOSIS — M24569 Contracture, unspecified knee: Secondary | ICD-10-CM | POA: Insufficient documentation

## 2010-11-14 DIAGNOSIS — N319 Neuromuscular dysfunction of bladder, unspecified: Secondary | ICD-10-CM

## 2010-11-14 DIAGNOSIS — M24529 Contracture, unspecified elbow: Secondary | ICD-10-CM | POA: Insufficient documentation

## 2010-11-14 DIAGNOSIS — K592 Neurogenic bowel, not elsewhere classified: Secondary | ICD-10-CM | POA: Insufficient documentation

## 2010-11-14 DIAGNOSIS — X58XXXS Exposure to other specified factors, sequela: Secondary | ICD-10-CM | POA: Insufficient documentation

## 2010-11-14 DIAGNOSIS — S14101A Unspecified injury at C1 level of cervical spinal cord, initial encounter: Secondary | ICD-10-CM

## 2010-11-14 DIAGNOSIS — IMO0002 Reserved for concepts with insufficient information to code with codable children: Secondary | ICD-10-CM | POA: Insufficient documentation

## 2010-11-14 DIAGNOSIS — M24539 Contracture, unspecified wrist: Secondary | ICD-10-CM | POA: Insufficient documentation

## 2010-11-14 DIAGNOSIS — M7500 Adhesive capsulitis of unspecified shoulder: Secondary | ICD-10-CM | POA: Insufficient documentation

## 2010-11-14 DIAGNOSIS — L8990 Pressure ulcer of unspecified site, unspecified stage: Secondary | ICD-10-CM

## 2010-11-14 NOTE — Assessment & Plan Note (Signed)
[name] is back regarding his spinal cord injury at the C5-C6 levels. With his Botox injections to his forearms and finger flexors.  He had some benefit, especially on the left side.  As I suspected, there is underlying contracture more so on the right than left.  He is still having some problems with constipation, although he is not implying regular bowel regimen.  He is afraid to take his Opana still on a regular basis, although he is doing better with that getting it to more of a q.12 h. schedule.  His pain is down overall at a 4-5/10.  Pain is stabbing and aching.  Sleep is fair.  He does do some stretching with his therapist as well as his wife at home, but is not consistent.  REVIEW OF SYSTEMS:  Notable for the above.  Full 12-point review is in the written health and history section of the chart.  SOCIAL HISTORY:  The patient is married and wife is with him today.  PHYSICAL EXAMINATION:  VITAL SIGNS:  Blood pressure is 81/43, pulse 77, respiratory rate 18 and he is satting 96% on room air. GENERAL:  The patient is pleasant and alert.  He appears to have lost some weight last visit. EXTREMITIES:  Left upper extremity strength is 2-2+/5.  He has contracture still at the wrist and elbow.  Left shoulder is tight as well.  Right upper extremity is 1 to 2-/5 with most weakness in the wrist flexors and hand intrinsics.  He has significant fractures at the wrist and finger flexors as well as the elbow.  He is able with some time to extend his wrist to neutral, however with full finger extension. Baseline hypergenicity on either the limb is 1 to 1+/4.  Lower extremity strength is 1+ to 2/5.  He has more strength proximally and distally. There is some slight contractures of the knee joints.  Resting tone is trace.  Sensation remains diminished in both legs and arms to a lesser extent.  ASSESSMENT: 1. C5-C6 spinal cord injury incomplete.  The patient likely with     concomitant  radiculopathy in the mid to lower cervical segments.     Upper extremity contractures are main issue here and likely the     main reason he did not respond fully to Botox injections. 2. Adhesive capsulitis, bilateral shoulders. 3. Neurogenic bowel and bladder. 4. Wound care issues.  PLAN: 1. Continue Opana ER 20 mg q.12 h.  He again impressed upon and the     importance of using this regularly.  He uses oxycodone 5 mg q.6 h.     p.r.n. #90. 2. Baclofen 20 mg q.6 for spasm. 3. Reviewed stretching with the patient and his wife and discussed     with them the importance of this.  It was very evident with some     simple stretching today how well he responded to that. 4. Discussed bowel regimen and he likely needs to be on MiraLax     scheduled every day with a suppository every other day.  Also,     encourage increased fluids and fiber intake. 5. He will see my nurse practitioner in a month, and I will see him     back in 3 months.     Ranelle Oyster, M.D. Electronically Signed    ZTS/MedQ D:  11/14/2010 11:09:33  T:  11/14/2010 12:01:32  Job #:  [id number]

## 2010-11-17 ENCOUNTER — Encounter (HOSPITAL_BASED_OUTPATIENT_CLINIC_OR_DEPARTMENT_OTHER): Payer: Medicare HMO | Attending: General Surgery

## 2010-11-17 DIAGNOSIS — L89899 Pressure ulcer of other site, unspecified stage: Secondary | ICD-10-CM | POA: Insufficient documentation

## 2010-11-17 DIAGNOSIS — L89109 Pressure ulcer of unspecified part of back, unspecified stage: Secondary | ICD-10-CM | POA: Insufficient documentation

## 2010-11-17 DIAGNOSIS — L8990 Pressure ulcer of unspecified site, unspecified stage: Secondary | ICD-10-CM | POA: Insufficient documentation

## 2010-11-17 DIAGNOSIS — L8993 Pressure ulcer of unspecified site, stage 3: Secondary | ICD-10-CM | POA: Insufficient documentation

## 2010-11-17 DIAGNOSIS — L89209 Pressure ulcer of unspecified hip, unspecified stage: Secondary | ICD-10-CM | POA: Insufficient documentation

## 2010-12-12 ENCOUNTER — Encounter: Payer: Medicare HMO | Attending: Neurosurgery | Admitting: Neurosurgery

## 2010-12-12 ENCOUNTER — Ambulatory Visit: Payer: Medicare Other | Admitting: Neurosurgery

## 2010-12-12 DIAGNOSIS — N319 Neuromuscular dysfunction of bladder, unspecified: Secondary | ICD-10-CM

## 2010-12-12 DIAGNOSIS — IMO0002 Reserved for concepts with insufficient information to code with codable children: Secondary | ICD-10-CM | POA: Insufficient documentation

## 2010-12-12 DIAGNOSIS — K592 Neurogenic bowel, not elsewhere classified: Secondary | ICD-10-CM

## 2010-12-12 DIAGNOSIS — X58XXXS Exposure to other specified factors, sequela: Secondary | ICD-10-CM | POA: Insufficient documentation

## 2010-12-12 DIAGNOSIS — M7500 Adhesive capsulitis of unspecified shoulder: Secondary | ICD-10-CM | POA: Insufficient documentation

## 2010-12-12 DIAGNOSIS — G8928 Other chronic postprocedural pain: Secondary | ICD-10-CM

## 2010-12-13 NOTE — Assessment & Plan Note (Signed)
This is a patient of Dr. Riley Kill, seen for spinal cord injury at C5-6 level.  The patient still has pain in his shoulder as the contractures does not report any change.  Rates his average pain as 5, it is an aching type pain.  General activity level is 5.  Pain is worse in the morning.  Sleep patterns are good.  Pain is worse with sitting and activity.  Medication tends to help.  He uses a walker or wheelchair for ambulation.  He does not drive or climb steps.  He transfers alone. Functionally, he is on disability.  Needs help with ADLs.  REVIEW OF SYSTEMS:  Notable for difficulties as described above. Otherwise, unremarkable except for the contractures that are resulting from his injury.  PAST MEDICAL HISTORY:  Unchanged.  SOCIAL HISTORY:  Unchanged.  FAMILY HISTORY:  Unchanged.  PHYSICAL EXAMINATION:  VITAL SIGNS:  His blood pressure is 88/49, pulse is 80, respirations 22, his O2 sats 96 on room air. NEUROLOGIC:  Constitutionally, he is thin.  He is alert and oriented x3. He is able to communicate clearly, using a wheelchair for mobilization. He does have AFOs on bilaterally with splint.  Upper extremity strength is diminished bilaterally as well as lower extremities about 1. Sensation is diminished throughout.  ASSESSMENT: 1. C5-6 incomplete injury. 2. Adhesive capsulitis in the shoulders bilaterally. 3. Neurogenic bowel and bladder. 4. Resolving wound care issues.  PLAN: 1. Refill oxycodone 5 mg, one p.o. q.6 hours p.r.n., #90 with no     refill. 2. Opana ER 20 mg, one p.o. q.12 hours, #60 with no refill.  Their     questions were encouraged and answered.  We will see them in a     month.     [name] Electronically Signed    RLW/MedQ D:  12/12/2010 14:19:19  T:  12/13/2010 02:37:06  Job #:  [id number]

## 2010-12-22 ENCOUNTER — Encounter (HOSPITAL_BASED_OUTPATIENT_CLINIC_OR_DEPARTMENT_OTHER): Payer: Medicare HMO | Attending: General Surgery

## 2010-12-22 DIAGNOSIS — L8990 Pressure ulcer of unspecified site, unspecified stage: Secondary | ICD-10-CM | POA: Insufficient documentation

## 2010-12-22 DIAGNOSIS — L8993 Pressure ulcer of unspecified site, stage 3: Secondary | ICD-10-CM | POA: Insufficient documentation

## 2010-12-22 DIAGNOSIS — L89109 Pressure ulcer of unspecified part of back, unspecified stage: Secondary | ICD-10-CM | POA: Insufficient documentation

## 2010-12-22 DIAGNOSIS — L89209 Pressure ulcer of unspecified hip, unspecified stage: Secondary | ICD-10-CM | POA: Insufficient documentation

## 2010-12-22 DIAGNOSIS — L89899 Pressure ulcer of other site, unspecified stage: Secondary | ICD-10-CM | POA: Insufficient documentation

## 2011-01-12 ENCOUNTER — Encounter (HOSPITAL_BASED_OUTPATIENT_CLINIC_OR_DEPARTMENT_OTHER): Payer: Medicare HMO | Attending: General Surgery

## 2011-01-12 DIAGNOSIS — L89109 Pressure ulcer of unspecified part of back, unspecified stage: Secondary | ICD-10-CM | POA: Insufficient documentation

## 2011-01-12 DIAGNOSIS — IMO0002 Reserved for concepts with insufficient information to code with codable children: Secondary | ICD-10-CM | POA: Insufficient documentation

## 2011-01-12 DIAGNOSIS — L8990 Pressure ulcer of unspecified site, unspecified stage: Secondary | ICD-10-CM | POA: Insufficient documentation

## 2011-01-12 DIAGNOSIS — L89899 Pressure ulcer of other site, unspecified stage: Secondary | ICD-10-CM | POA: Insufficient documentation

## 2011-01-12 DIAGNOSIS — Z79899 Other long term (current) drug therapy: Secondary | ICD-10-CM | POA: Insufficient documentation

## 2011-01-12 DIAGNOSIS — X58XXXS Exposure to other specified factors, sequela: Secondary | ICD-10-CM | POA: Insufficient documentation

## 2011-01-12 DIAGNOSIS — L89209 Pressure ulcer of unspecified hip, unspecified stage: Secondary | ICD-10-CM | POA: Insufficient documentation

## 2011-01-12 DIAGNOSIS — G8250 Quadriplegia, unspecified: Secondary | ICD-10-CM | POA: Insufficient documentation

## 2011-01-12 DIAGNOSIS — Z8614 Personal history of Methicillin resistant Staphylococcus aureus infection: Secondary | ICD-10-CM | POA: Insufficient documentation

## 2011-01-16 ENCOUNTER — Encounter: Payer: Medicare HMO | Attending: Physical Medicine & Rehabilitation | Admitting: Physical Medicine & Rehabilitation

## 2011-01-16 DIAGNOSIS — IMO0002 Reserved for concepts with insufficient information to code with codable children: Secondary | ICD-10-CM | POA: Insufficient documentation

## 2011-01-16 DIAGNOSIS — N319 Neuromuscular dysfunction of bladder, unspecified: Secondary | ICD-10-CM

## 2011-01-16 DIAGNOSIS — K592 Neurogenic bowel, not elsewhere classified: Secondary | ICD-10-CM

## 2011-01-16 DIAGNOSIS — M7500 Adhesive capsulitis of unspecified shoulder: Secondary | ICD-10-CM | POA: Insufficient documentation

## 2011-01-16 DIAGNOSIS — S14101A Unspecified injury at C1 level of cervical spinal cord, initial encounter: Secondary | ICD-10-CM

## 2011-01-16 DIAGNOSIS — L8990 Pressure ulcer of unspecified site, unspecified stage: Secondary | ICD-10-CM

## 2011-01-16 DIAGNOSIS — G8254 Quadriplegia, C5-C7 incomplete: Secondary | ICD-10-CM | POA: Insufficient documentation

## 2011-01-16 DIAGNOSIS — X58XXXS Exposure to other specified factors, sequela: Secondary | ICD-10-CM | POA: Insufficient documentation

## 2011-01-16 NOTE — Assessment & Plan Note (Signed)
Mr. Mcquerry is back regarding his spinal cord injury.  The pain has been under fairly good control recently.  She continues to have some tightness particularly in the right side.  He is curious as to when we can repeat his Botox injections. He augmented his bowel regimen as we recommended.  He was having a daily movement with a suppository in the nighttime MiraLax, but whatever reason did not feel that he wanted to have a bowel movement every day and now he is back to sporadic schedule. Currently, has a colonoscopy set up here over the next week or so. Sleep has generally been good.  Pain ranges from a 3-5/10.  REVIEW OF SYSTEMS:  Notable for the above.  Full 12-point review is in the written health and history section of the chart.  SOCIAL HISTORY:  The patient is married.  Wife is with him today and remains supportive as always.  PHYSICAL EXAMINATION:  VITAL SIGNS:  Blood pressure is 99/54, pulse 72, respiratory rate 16 and he is satting 99% on room air. GENERAL:  The patient is generally pleasant and alert.  He has put on some weight. EXTREMITIES:  He has 1+ to 2/5 strength in the left hand, wrist, elbow and shoulder.  Underlying tone is 1-2/4.  Right upper extremity strength is grossly 0 to trace out of 5 with underlying tone in the 3-4/4 range particularly at the shoulder and elbow.  There are likely contractures at the shoulder and head.  Sensory exam remains diminished to the level of his injury.  Lower extremity exam is unchanged.  NEUROLOGIC: Cognitively he is alert and appropriate.  ASSESSMENT: 1. 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 2. Adhesive capsulitis of the bilateral shoulders. 3. Neurogenic bowel and bladder. 4. Wound care issues.  PLAN: 1. Encouraged the patient and his wife to resume their bowel program     including MiraLAX at nighttime and Dulcolax suppository in a.m. 2. We will set the patient up for Botox injections to the right  pectoralis major, minor, right biceps and perhaps left pectoralis     major and minor.  We will use 400 units. 3. Refilled his oxycodone 5 mg, #90, 1 q.6 h. p.r.n. and Opana ER 20     mg, #90, 1 q.12 h. schedule. 4. I will see him back in about a month.     Ranelle Oyster, M.D. Electronically Signed    ZTS/MedQ D:  01/16/2011 12:54:22  T:  01/16/2011 19:58:07  Job #:  [id number]

## 2011-01-24 ENCOUNTER — Encounter (HOSPITAL_COMMUNITY): Payer: Self-pay | Admitting: *Deleted

## 2011-01-24 ENCOUNTER — Observation Stay (HOSPITAL_COMMUNITY)
Admission: AD | Admit: 2011-01-24 | Discharge: 2011-01-25 | Disposition: A | Discharge status: Home / Self Care | Payer: Medicare HMO | Source: Ambulatory Visit | Attending: Internal Medicine | Admitting: Internal Medicine

## 2011-01-24 DIAGNOSIS — K269 Duodenal ulcer, unspecified as acute or chronic, without hemorrhage or perforation: Secondary | ICD-10-CM | POA: Insufficient documentation

## 2011-01-24 DIAGNOSIS — IMO0002 Reserved for concepts with insufficient information to code with codable children: Secondary | ICD-10-CM | POA: Insufficient documentation

## 2011-01-24 DIAGNOSIS — D509 Iron deficiency anemia, unspecified: Principal | ICD-10-CM | POA: Insufficient documentation

## 2011-01-24 DIAGNOSIS — G8250 Quadriplegia, unspecified: Secondary | ICD-10-CM | POA: Insufficient documentation

## 2011-01-24 DIAGNOSIS — I959 Hypotension, unspecified: Secondary | ICD-10-CM | POA: Diagnosis present

## 2011-01-24 DIAGNOSIS — L89159 Pressure ulcer of sacral region, unspecified stage: Secondary | ICD-10-CM | POA: Diagnosis present

## 2011-01-24 DIAGNOSIS — D62 Acute posthemorrhagic anemia: Secondary | ICD-10-CM | POA: Diagnosis present

## 2011-01-24 DIAGNOSIS — X58XXXS Exposure to other specified factors, sequela: Secondary | ICD-10-CM | POA: Insufficient documentation

## 2011-01-24 DIAGNOSIS — G8220 Paraplegia, unspecified: Secondary | ICD-10-CM | POA: Diagnosis present

## 2011-01-24 HISTORY — DX: Reserved for inherently not codable concepts without codable children: IMO0001

## 2011-01-24 HISTORY — DX: Encounter for other specified aftercare: Z51.89

## 2011-01-24 HISTORY — DX: Anemia, unspecified: D64.9

## 2011-01-24 LAB — COMPREHENSIVE METABOLIC PANEL
Albumin: 3.1 g/dL — ABNORMAL LOW (ref 3.5–5.2)
Alkaline Phosphatase: 102 U/L (ref 39–117)
BUN: 15 mg/dL (ref 6–23)
Calcium: 9.8 mg/dL (ref 8.4–10.5)
GFR calc Af Amer: >90 mL/min (ref >90)
Glucose, Bld: 89 mg/dL (ref 70–99)
Potassium: 4 mEq/L (ref 3.5–5.1)
Sodium: 134 mEq/L — ABNORMAL LOW (ref 135–145)
Total Protein: 8.3 g/dL (ref 6.0–8.3)

## 2011-01-24 LAB — MAGNESIUM: Magnesium: 2.2 mg/dL (ref 1.5–2.5)

## 2011-01-24 LAB — CBC
HCT: 25.7 % — ABNORMAL LOW (ref 39.0–52.0)
Hemoglobin: 7.9 g/dL — ABNORMAL LOW (ref 13.0–17.0)
RBC: 3.21 MIL/uL — ABNORMAL LOW (ref 4.22–5.81)

## 2011-01-24 MED ORDER — BACLOFEN 20 MG PO TABS
20.0000 mg | ORAL_TABLET | Freq: Four times a day (QID) | ORAL | Status: DC
  Administered 2011-01-24: 20 mg via ORAL
  Filled 2011-01-24 (×9): qty 1

## 2011-01-24 MED ORDER — SENNOSIDES-DOCUSATE SODIUM 8.6-50 MG PO TABS
1.0000 | ORAL_TABLET | Freq: Every day | ORAL | Status: DC
  Administered 2011-01-24: 1 via ORAL
  Filled 2011-01-24 (×3): qty 1

## 2011-01-24 MED ORDER — COLLAGENASE 250 UNIT/GM EX OINT
TOPICAL_OINTMENT | Freq: Every day | CUTANEOUS | Status: DC
  Administered 2011-01-24 – 2011-01-25 (×2): via TOPICAL
  Filled 2011-01-24: qty 30

## 2011-01-24 MED ORDER — ENOXAPARIN SODIUM 40 MG/0.4ML ~~LOC~~ SOLN
40.0000 mg | SUBCUTANEOUS | Status: DC
  Administered 2011-01-24: 40 mg via SUBCUTANEOUS
  Filled 2011-01-24 (×3): qty 0.4

## 2011-01-24 MED ORDER — ACETAMINOPHEN 650 MG RE SUPP: 650.0000 mg | Freq: Four times a day (QID) | RECTAL | Status: DC | PRN

## 2011-01-24 MED ORDER — GABAPENTIN 600 MG PO TABS
600.0000 mg | ORAL_TABLET | Freq: Three times a day (TID) | ORAL | Status: DC
  Administered 2011-01-24: 600 mg via ORAL
  Filled 2011-01-24 (×7): qty 1

## 2011-01-24 MED ORDER — OXYCODONE HCL 5 MG PO TABS: 5.0000 mg | ORAL_TABLET | ORAL | Status: DC | PRN

## 2011-01-24 MED ORDER — CHOLECALCIFEROL 10 MCG (400 UNIT) PO TABS
400.0000 [IU] | ORAL_TABLET | Freq: Every day | ORAL | Status: DC
  Administered 2011-01-24: 400 [IU] via ORAL
  Filled 2011-01-24 (×3): qty 1

## 2011-01-24 MED ORDER — CITALOPRAM HYDROBROMIDE 20 MG PO TABS
20.0000 mg | ORAL_TABLET | Freq: Every day | ORAL | Status: DC
  Administered 2011-01-24: 20 mg via ORAL
  Filled 2011-01-24 (×3): qty 1

## 2011-01-24 MED ORDER — ONDANSETRON HCL 4 MG PO TABS: 4.0000 mg | ORAL_TABLET | Freq: Four times a day (QID) | ORAL | Status: DC | PRN

## 2011-01-24 MED ORDER — PEG 3350-KCL-NA BICARB-NACL 420 G PO SOLR
4000.0000 mL | Freq: Once | ORAL | Status: AC
  Administered 2011-01-24: 4000 mL via ORAL
  Filled 2011-01-24 (×2): qty 4000

## 2011-01-24 MED ORDER — ONDANSETRON HCL 4 MG/2ML IJ SOLN: 4.0000 mg | Freq: Four times a day (QID) | INTRAMUSCULAR | Status: DC | PRN

## 2011-01-24 MED ORDER — TEMAZEPAM 15 MG PO CAPS
30.0000 mg | ORAL_CAPSULE | Freq: Every day | ORAL | Status: DC
  Administered 2011-01-24: 30 mg via ORAL
  Filled 2011-01-24 (×2): qty 1

## 2011-01-24 MED ORDER — ACETAMINOPHEN 325 MG PO TABS: 650.0000 mg | ORAL_TABLET | Freq: Four times a day (QID) | ORAL | Status: DC | PRN

## 2011-01-24 MED ORDER — OXYMORPHONE HCL ER 20 MG PO TB12: 20.0000 mg | ORAL_TABLET | Freq: Two times a day (BID) | ORAL | Status: DC

## 2011-01-24 NOTE — Progress Notes (Signed)
I received Mr. Diviney as a direct admit pt at 1520.  He arrived to the unit in a wheelchair with his wife.  When he arrived to the unit I had no admitting orders.  I called Dr. Clent Ridges for admission orders (she saw the pt pior to admit), I spoke with Annice Pih her nurse and she stated that the pt was being followed by Dr. Elisabeth Pigeon on team one.  I paged this doctor and she states that she knows nothing about the patient and she is not admitting them and she states Dr. Clent Ridges needs to admit the pt.  I call Dr. Clent Ridges office back and speak with Dr. Clent Ridges, she states that she can not admit pt's to [place] and it was her understanding that if she saw the pt within 4hr of the patient coming to the hospital they could be a direct admit.  I then call Dr. Elisabeth Pigeon back and explained this to her, she gave me this number [phone] and said they would be able to tell me which doctor was taking the pt.  I called [phone] and they said Dr. Susie Cassette was taking the pt and would be up to admit the pt. I get admission orders at 1845 that state the pt needs a telemetry bed.  I called Dr. Susie Cassette and asked if it could be discontinued or does the pt need to move to a telemetry bed, she stated he needed to be moved to a telemetry bed.  I notified Mercy Moore, RN our charge nurse that the patient needed to be moved. He found out there were no telemetry beds.  At [facility], NP called me and said the pt needed to go to the ED since we did not have a telemetry bed.  I got Mercy Moore, RN to speak with her.  The pt is stable, call bell within reach, will continue to monitor.

## 2011-01-24 NOTE — Progress Notes (Signed)
Pt arrived to room 1333 via wheelchair from home.  Pt is alert and oriented x3, VSS charted in epic.  Oriented pt and family to room and unit.  Pt is unable to stand, arms are contracted.  Pt has multiple wounds and pressure sores that are present on admission.  Call bell is within reach.  Will continue to monitor.

## 2011-01-24 NOTE — Progress Notes (Signed)
Patient was admitted to [facility] at 1520.  Hazel Sams, RN was unable to obtain admission orders until Dr. Susie Cassette saw patient at approximately 1830.  Dr. Susie Cassette ordered for cardiac monitoring at 1846 and we are not a telemetry unit.  There are currently no telemetry beds available.  Jamie notified Dr. Susie Cassette to see if order could be discontinued but she stated that patient needed to be on telemetry.  Received a call from Gwinda Passe, NP on call for hospitalists at 1920.  She requested that the patient be sent to the Emergency Department to be monitored until a telemetry bed is available.  I put her in contact with Georgette Shell, [facility] at 1925 and Huntley Dec stated that the patient could not go to the Emergency Department and that she was actively looking for a telemetry bed for this patient.  Allayne Butcher [facility] 01/24/2011 7:41 PM

## 2011-01-24 NOTE — H&P (Signed)
[name] MRN: [id number] DOB/AGE: 08-22-1955 56 y.o. Primary Care Physician: Dr. Elby Showers Admit date: 01/24/2011 Chief Complaint: Needs a colonoscopy  HPI: This 56 year old male was a chronic alcohol   abuser.  He fell in July and sustained a spinal injury.  He has had   several back surgeries including fusions and has now a rather high   paraparesis.  He is not completely paralyzed or completely without   sensation, but he is basically bedridden.  During his prolonged   hospitalization and rehab, he has developed several ulcers.  These have   been treated by his wife at home. He also was to the wound care center and has multiple decubitus ulcers on his hip and lower back heels and bilateral lower extremities.  He is being committed to [facility] for a colonoscopy tomorrow. Dr. Daphine Deutscher suggested that because of his multiple sacral decubitus ulcers and his paraplegia, it would be difficult to prepare him at home for the colonoscopy.  Past Medical History  Diagnosis Date  . C1-C4 level spinal cord injury with other specified spinal cord injury, without evidence of spinal bone injury   . Chronic ulcer of skin   . Neurogenic bowel   . Neurogenic bladder   . Congenital quadriplegia   . Anemia   . Asthma   . Blood transfusion     Past Surgical History  Procedure Date  . Back surgery    C4-C5 corpectomy and dissectomy and decompression fusion of C3-C6 in June of 2011  Prior to Admission medications   Medication Sig Start Date End Date Taking? Authorizing Provider  aspirin 325 MG tablet Take 325 mg by mouth daily.   Yes Historical Provider, MD  baclofen (LIORESAL) 20 MG tablet Take 20 mg by mouth 4 (four) times daily.   Yes Historical Provider, MD  cholecalciferol (VITAMIN D) 400 UNITS TABS Take 400 Units by mouth daily.   Yes Historical Provider, MD  citalopram (CELEXA) 20 MG tablet Take 20 mg by mouth daily.   Yes Historical Provider, MD  Ferrous Gluconate (IRON) 240 (27  FE) MG TABS Take 1 tablet by mouth daily.   Yes Historical Provider, MD  folic acid (FOLVITE) 400 MCG tablet Take 400 mcg by mouth daily.   Yes Historical Provider, MD  gabapentin (NEURONTIN) 600 MG tablet Take 600 mg by mouth 3 (three) times daily.   Yes Historical Provider, MD  Multiple Vitamin (MULITIVITAMIN WITH MINERALS) TABS Take 1 tablet by mouth daily.   Yes Historical Provider, MD  oxycodone (OXY-IR) 5 MG capsule Take 5 mg by mouth every 6 (six) hours as needed. For pain   Yes Historical Provider, MD  oxymorphone (OPANA ER) 20 MG 12 hr tablet Take 20 mg by mouth every 12 (twelve) hours as needed. For pain   Yes Historical Provider, MD  pyridOXINE (VITAMIN B-6) 50 MG tablet Take 50 mg by mouth daily.   Yes Historical Provider, MD  senna-docusate (SENOKOT-S) 8.6-50 MG per tablet Take 1 tablet by mouth daily.   Yes Historical Provider, MD  temazepam (RESTORIL) 30 MG capsule Take 30 mg by mouth at bedtime.   Yes Historical Provider, MD    Allergies: No Known Allergies  History reviewed. No pertinent family history.  SOCIAL HISTORY:  The patient lives at home with the wife.  Quit smoking   cigarettes in August after his injury to the back.  He also quit alcohol   at that time and does not do illicit drugs.  Family history father deceased at 11 from a stroke, mother deceased at 16 had hypertension dementia Brother deceased at 74 from a stroke Another brother 33 years old      ROS the complete 14 point review of systems was done and pertinent positives positives documented in history of present illness PHYSICAL EXAM: Blood pressure 80/52, pulse 89, temperature 98.3 F (36.8 C), temperature source Oral, resp. rate 20, height 5\' 10"  (1.778 m), weight 51.8 kg (114 lb 3.2 oz), SpO2 97.00%. PHYSICAL EXAMINATION:  VITAL SIGNS:  Temperature 97.9, pulse 86,   respirations 16, blood pressure 97/68.   GENERAL APPEARANCE:  A very thin waisted male in no distress.  He has   severe  quadriparesis.   ABDOMEN:  Benign. Active bowel sounds  CHEST:  Clear.   HEART:  Regular rhythm.   EXTREMITIES:  He has good peripheral pulses.  He has on his sacrum a 10-   4 x 4.4 very shaggy decubitus.  He has on the anterior foot on the left   side a 2.5 x 1.5 superficial wound.  On the right heel, he has a 0.6 x   0.4 decubitus and in left heel a 2.6 x 3.2 decubitus.  Neurologic paraplegic with multiple contractures in bilateral upper and lower extremities Psychiatric appropriate mood and affect      No results found for this or any previous visit (from the past 240 hour(s)).   No results found for this or any previous visit (from the past 48 hour(s)).  No results found.  Impression:  Chronic hypotension  Paraplegia Chronic pain syndrome  Sacral decubitus ulcer     Plan  The patient will be admitted for colonoscopy possibly tomorrow morning Dr. Danise Edge is aware We'll obtain baseline labs Start GoLYTELY N.p.o. past midnight No active bleeding at this point follow CBC We'll get consultation     [facility] 01/24/2011, 6:42 PM

## 2011-01-25 ENCOUNTER — Encounter (HOSPITAL_COMMUNITY): Admission: RE | Payer: Self-pay | Source: Ambulatory Visit

## 2011-01-25 ENCOUNTER — Encounter (HOSPITAL_COMMUNITY)
Admission: AD | Disposition: A | Discharge status: Home / Self Care | Payer: Self-pay | Source: Ambulatory Visit | Attending: Internal Medicine

## 2011-01-25 ENCOUNTER — Encounter (HOSPITAL_COMMUNITY): Payer: Self-pay

## 2011-01-25 ENCOUNTER — Ambulatory Visit (HOSPITAL_COMMUNITY): Admission: RE | Admit: 2011-01-25 | Payer: Medicare HMO | Source: Ambulatory Visit | Admitting: Gastroenterology

## 2011-01-25 ENCOUNTER — Observation Stay (HOSPITAL_COMMUNITY): Payer: Medicare HMO | Admitting: Anesthesiology

## 2011-01-25 ENCOUNTER — Encounter (HOSPITAL_COMMUNITY): Payer: Self-pay | Admitting: Anesthesiology

## 2011-01-25 DIAGNOSIS — D62 Acute posthemorrhagic anemia: Secondary | ICD-10-CM | POA: Diagnosis present

## 2011-01-25 LAB — CBC
HCT: 27 % — ABNORMAL LOW (ref 39.0–52.0)
Hemoglobin: 8.2 g/dL — ABNORMAL LOW (ref 13.0–17.0)
MCV: 80.6 fL (ref 78.0–100.0)
RBC: 3.35 MIL/uL — ABNORMAL LOW (ref 4.22–5.81)
WBC: 8.1 10*3/uL (ref 4.0–10.5)

## 2011-01-25 LAB — TSH: TSH: 0.624 u[IU]/mL (ref 0.350–4.500)

## 2011-01-25 LAB — BASIC METABOLIC PANEL
CO2: 29 mEq/L (ref 19–32)
Chloride: 102 mEq/L (ref 96–112)
Creatinine, Ser: 0.43 mg/dL — ABNORMAL LOW (ref 0.50–1.35)
Potassium: 4.1 mEq/L (ref 3.5–5.1)
Sodium: 138 mEq/L (ref 135–145)

## 2011-01-25 SURGERY — EGD (ESOPHAGOGASTRODUODENOSCOPY)
Anesthesia: Choice

## 2011-01-25 SURGERY — ESOPHAGOGASTRODUODENOSCOPY (EGD) WITH PROPOFOL
Anesthesia: General

## 2011-01-25 MED ORDER — SODIUM CHLORIDE 0.9 % IV SOLN
INTRAVENOUS | Status: DC | PRN
  Administered 2011-01-25: 15:00:00 via INTRAVENOUS

## 2011-01-25 MED ORDER — MORPHINE SULFATE 2 MG/ML IJ SOLN
INTRAMUSCULAR | Status: AC
  Filled 2011-01-25: qty 1

## 2011-01-25 MED ORDER — MORPHINE SULFATE 2 MG/ML IJ SOLN
0.5000 mg | INTRAMUSCULAR | Status: DC | PRN
  Administered 2011-01-25: 0.5 mg via INTRAVENOUS

## 2011-01-25 MED ORDER — PHENYLEPHRINE HCL 10 MG/ML IJ SOLN
INTRAMUSCULAR | Status: DC | PRN
  Administered 2011-01-25: 50 ug via INTRAVENOUS

## 2011-01-25 MED ORDER — MORPHINE SULFATE CR 30 MG PO TB12
60.0000 mg | ORAL_TABLET | Freq: Two times a day (BID) | ORAL | Status: DC
  Administered 2011-01-25: 60 mg via ORAL
  Filled 2011-01-25: qty 2

## 2011-01-25 MED ORDER — ETOMIDATE 2 MG/ML IV SOLN
INTRAVENOUS | Status: DC | PRN
  Administered 2011-01-25: 14 mg via INTRAVENOUS

## 2011-01-25 MED ORDER — FENTANYL CITRATE 0.05 MG/ML IJ SOLN
INTRAMUSCULAR | Status: DC | PRN
  Administered 2011-01-25: 50 ug via INTRAVENOUS

## 2011-01-25 MED ORDER — FENTANYL CITRATE 0.05 MG/ML IJ SOLN: 25.0000 ug | INTRAMUSCULAR | Status: DC | PRN

## 2011-01-25 MED ORDER — PROMETHAZINE HCL 25 MG/ML IJ SOLN: 6.2500 mg | INTRAMUSCULAR | Status: DC | PRN

## 2011-01-25 NOTE — Op Note (Signed)
Problem: Unexplained iron deficiency anemia  Procedure: Diagnostic esophagogastroduodenoscopy  Endoscopist: Danise Edge  Premedication: Propofol administered by anesthesia  Procedure: The patient was placed in the left lateral decubitus position. The Pentax gastroscope was passed through the posterior hypopharynx into the proximal esophagus without difficulty.  Esophagoscopy: The proximal, mid, and lower segments of the esophageal mucosa appear normal. There is no esophageal bleeding or esophageal varices.  Gastroscopy: Retroflex view of the gastric cardia and fundus was normal. The gastric body, antrum, and pylorus appeared normal. There was no bleeding from the stomach. There are no gastric varices.  Duodenoscopy: The duodenal bulb and descending duodenum appeared normal except for shallow erosions in the duodenal bulb unassociated with bleeding.  Assessment: Normal esophagogastroduodenoscopy except for the presence of small duodenal bulb erosions associated with upper gastrointestinal bleeding.  Plan: Proceed with diagnostic colonoscopy.

## 2011-01-25 NOTE — Progress Notes (Signed)
I have directly reviewed the clinical findings, lab, imaging studies and management of this patient in detail. I have interviewed and examined the pt and agree with the documentation,  as recorded by PA.  Leroy Sea M.D on 01/25/2011 at 9:15 AM  [facility] Office  [phone]

## 2011-01-25 NOTE — Discharge Summary (Addendum)
Patient ID: [name] MRN: [id number] DOB/AGE: 1955/10/31 56 y.o.  Admit date: 01/24/2011 Discharge date: 01/25/2011  Primary Care Physician:  No primary provider on file.  Discharge Diagnoses:    Present on Admission:  .Paraplegia .Hypotension .Sacral decubitus ulcer .Acute blood loss anemia  Principal Problem:  *Acute blood loss anemia Active Problems:  Paraplegia  Hypotension  Sacral decubitus ulcer   Medication List  As of 01/25/2011  5:42 PM   TAKE these medications         aspirin 325 MG tablet   Take 325 mg by mouth daily.      baclofen 20 MG tablet   Commonly known as: LIORESAL   Take 20 mg by mouth 4 (four) times daily.      cholecalciferol 400 UNITS Tabs   Commonly known as: VITAMIN D   Take 400 Units by mouth daily.      citalopram 20 MG tablet   Commonly known as: CELEXA   Take 20 mg by mouth daily.      folic acid 400 MCG tablet   Commonly known as: FOLVITE   Take 400 mcg by mouth daily.      gabapentin 600 MG tablet   Commonly known as: NEURONTIN   Take 600 mg by mouth 3 (three) times daily.      Iron 240 (27 FE) MG Tabs   Take 1 tablet by mouth daily.      mulitivitamin with minerals Tabs   Take 1 tablet by mouth daily.      oxycodone 5 MG capsule   Commonly known as: OXY-IR   Take 5 mg by mouth every 6 (six) hours as needed. For pain      oxymorphone 20 MG 12 hr tablet   Commonly known as: OPANA ER   Take 20 mg by mouth every 12 (twelve) hours as needed. For pain      pyridOXINE 50 MG tablet   Commonly known as: VITAMIN B-6   Take 50 mg by mouth daily.      senna-docusate 8.6-50 MG per tablet   Commonly known as: Senokot-S   Take 1 tablet by mouth daily.      temazepam 30 MG capsule   Commonly known as: RESTORIL   Take 30 mg by mouth at bedtime.            Disposition and Follow-up: PCP in 1 week  Consults:  1. gastroenterology  Significant Diagnostic Studies:  No results found.  Brief H and P: The patient is  a 56 year old male with unexplained iron deficiency anemia associated with a hemoglobin 8.5 gm, serum iron saturation 9%, and normal serum ferritin the patient denies a history of colorectal neoplasia. There is no family history of colorectal neoplasia. He denies gastrointestinal bleeding, hematuria, or hemoptysis. She denies a history of peptic ulcer disease. He does have a history of alcoholism. He has never undergone colorectal neoplasia screening.   Physical Exam on Discharge:  Filed Vitals:   01/25/11 1610 01/25/11 1620 01/25/11 1630 01/25/11 1635  BP: 115/69 102/66 102/66 106/71  Pulse:      Temp:      TempSrc:      Resp: 14 14 14 15   Height:      Weight:      SpO2: 96% 95% 95% 95%     Intake/Output Summary (Last 24 hours) at 01/25/11 1742 Last data filed at 01/25/11 1635  Gross per 24 hour  Intake   1000 ml  Output  1025 ml  Net    -25 ml    General: Alert, awake, oriented x3, in no acute distress. HEENT: No bruits, no goiter. Heart: Regular rate and rhythm, without murmurs, rubs, gallops. Lungs: Clear to auscultation bilaterally. Abdomen: Soft, nontender, nondistended, positive bowel sounds. Extremities: No clubbing cyanosis or edema with positive pedal pulses. Neuro: Grossly intact, nonfocal.  CBC:    Component Value Date/Time   WBC 8.1 01/25/2011 0628   HGB 8.2* 01/25/2011 0628   HCT 27.0* 01/25/2011 0628   PLT 394 01/25/2011 0628   MCV 80.6 01/25/2011 0628   NEUTROABS 19.2* 09/14/2009 0530   LYMPHSABS 3.6 09/14/2009 0530   MONOABS 2.4* 09/14/2009 0530   EOSABS 0.2 09/14/2009 0530   BASOSABS 0.1 09/14/2009 0530    Basic Metabolic Panel:    Component Value Date/Time   NA 138 01/25/2011 0628   K 4.1 01/25/2011 0628   CL 102 01/25/2011 0628   CO2 29 01/25/2011 0628   BUN 13 01/25/2011 0628   CREATININE 0.43* 01/25/2011 0628   GLUCOSE 107* 01/25/2011 0628   CALCIUM 9.8 01/25/2011 0628    Hospital Course:  Principal Problem:  *Acute blood loss anemia - patient   Was supposed to go for colonoscopy but was canceled due to inadequate bowel preparation. As per GI, colonoscopy will be reschedule for another date.  - patient needed colonoscopy due to acute drop in hemoglobin level. Patient was actually sent by GI MD for elective colonoscopy. Patient is stable to be discharged home.   Time spent on Discharge: Greater than 30 minutes  Signed: Sorayah Schrodt 01/25/2011, 5:42 PM

## 2011-01-25 NOTE — Progress Notes (Signed)
Subjective: No event during night. Reports pain left shoulder. No other complaints  Objective: Vital signs Filed Vitals:   01/24/11 1609 01/24/11 1753 01/24/11 2125 01/25/11 0552  BP: 76/43 80/52 99/65  92/58  Pulse: 89  76 85  Temp: 98.3 F (36.8 C)  98.3 F (36.8 C) 97.6 F (36.4 C)  TempSrc: Oral  Oral Oral  Resp: 20  20 20   Height: 5\' 10"  (1.778 m)  5\' 10"  (1.778 m)   Weight: 51.8 kg (114 lb 3.2 oz)  46.4 kg (102 lb 4.7 oz)   SpO2: 97%  99% 95%   Weight change:  Last BM Date: 01/23/11  Intake/Output from previous day: 01/23 0701 - 01/24 0700 In: -  Out: 1025 [Urine:125; Stool:900]     Physical Exam: General: Alert, awake, oriented x3, in no acute distress. Frail appearing, thin HEENT: No bruits, no goiter. Heart: Regular rate and rhythm, without murmurs, rubs, gallops. Lungs: Normal effort but somewhat shallow. Breath sounds distant but otherwise clear to auscultation bilaterally. Abdomen: Soft, nontender, nondistended, positive bowel sounds. Rectal tube draining loose brown stool  Extremities: Contracted bilaterally PPP Neuro: Grossly intact, nonfocal. Speech clear Skin: Decubitus on sacrum, anterior left foot, right heel, left heel, all dressings dry and intact    Lab Results: Basic Metabolic Panel:  Basename 01/25/11 0628 01/24/11 1920  NA 138 134*  K 4.1 4.0  CL 102 97  CO2 29 30  GLUCOSE 107* 89  BUN 13 15  CREATININE 0.43* 0.50  CALCIUM 9.8 9.8  MG -- 2.2  PHOS -- --   Liver Function Tests:  [facility] 01/24/11 1920  AST 14  ALT 9  ALKPHOS 102  BILITOT 0.3  PROT 8.3  ALBUMIN 3.1*   No results found for this basename: LIPASE:2,AMYLASE:2 in the last 72 hours No results found for this basename: AMMONIA:2 in the last 72 hours CBC:  Basename 01/25/11 0628 01/24/11 1920  WBC 8.1 9.3  NEUTROABS -- --  HGB 8.2* 7.9*  HCT 27.0* 25.7*  MCV 80.6 80.1  PLT 394 389   Cardiac Enzymes: No results found for this basename:  CKTOTAL:3,CKMB:3,CKMBINDEX:3,TROPONINI:3 in the last 72 hours BNP: No results found for this basename: PROBNP:3 in the last 72 hours D-Dimer: No results found for this basename: DDIMER:2 in the last 72 hours CBG: No results found for this basename: GLUCAP:6 in the last 72 hours Hemoglobin A1C: No results found for this basename: HGBA1C in the last 72 hours Fasting Lipid Panel: No results found for this basename: CHOL,HDL,LDLCALC,TRIG,CHOLHDL,LDLDIRECT in the last 72 hours Thyroid Function Tests:  Basename 01/24/11 1920  TSH 0.624  T4TOTAL --  FREET4 --  T3FREE --  THYROIDAB --   Anemia Panel: No results found for this basename: VITAMINB12,FOLATE,FERRITIN,TIBC,IRON,RETICCTPCT in the last 72 hours Coagulation: No results found for this basename: LABPROT:2,INR:2 in the last 72 hours Urine Drug Screen: Drugs of Abuse     Component Value Date/Time   LABOPIA NONE DETECTED 07/02/2009 1942   COCAINSCRNUR POSITIVE* 07/02/2009 1942   LABBENZ POSITIVE* 07/02/2009 1942   AMPHETMU NONE DETECTED 07/02/2009 1942   THCU NONE DETECTED 07/02/2009 1942   LABBARB  Value: NONE DETECTED        DRUG SCREEN FOR MEDICAL PURPOSES ONLY.  IF CONFIRMATION IS NEEDED FOR ANY PURPOSE, NOTIFY LAB WITHIN 5 DAYS.        LOWEST DETECTABLE LIMITS FOR URINE DRUG SCREEN Drug Class       Cutoff (ng/mL) Amphetamine      1000 Barbiturate  200 Benzodiazepine   200 Tricyclics       300 Opiates          300 Cocaine          300 THC              50 07/02/2009 1942    Alcohol Level: No results found for this basename: ETH:2 in the last 72 hours Urinalysis:  Misc. Labs:  No results found for this or any previous visit (from the past 240 hour(s)).  Studies/Results: No results found.  Medications: Scheduled Meds:   . baclofen  20 mg Oral QID  . cholecalciferol  400 Units Oral Daily  . citalopram  20 mg Oral Daily  . collagenase   Topical Daily  . enoxaparin  40 mg Subcutaneous Q24H  . gabapentin  600 mg Oral TID  .  morphine  60 mg Oral Q12H  . polyethylene glycol-electrolytes  4,000 mL Oral Once  . senna-docusate  1 tablet Oral Daily  . temazepam  30 mg Oral QHS  . DISCONTD: oxymorphone  20 mg Oral Q12H   Continuous Infusions:  PRN Meds:.acetaminophen, acetaminophen, ondansetron (ZOFRAN) IV, ondansetron, oxyCODONE  Assessment/Plan:  1. Anemia: somewhat chronic. Concern GI bleed. Hg 7.8 on admission. GI following and colonoscopy scheduled for this afternoon. Pt admitted for prep and rectal tube given multiple decubitus and bedridden state. No s'sx active blding.  2.Chronic hypotension : stable at baseline.  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 Has some movement and some sensation. 4.Chronic pain syndrome : pain management 5. Sacral decubitus ulcer : due to #3. Will request wound 6. Dispo: if colonoscopy negative may be able to be discharged this afternoon.     LOS: 1 day   [facility] M 01/25/2011, 8:46 AM

## 2011-01-25 NOTE — Clinical Documentation Improvement (Signed)
GENERIC DOCUMENTATION CLARIFICATION QUERY  THIS DOCUMENT IS NOT A PERMANENT PART OF THE MEDICAL RECORD  TO RESPOND TO THE THIS QUERY, FOLLOW THE INSTRUCTIONS BELOW:  1. If needed, update documentation for the patient's encounter via the notes activity.  2. Access this query again and click edit on the In [employer].  3. After updating, or not, click F2 to complete all highlighted (required) fields concerning your review. Select "additional documentation in the medical record" OR "no additional documentation provided".  4. Click Sign note button.  5. The deficiency will fall out of your In Basket *Please let us know if you are not able to complete this workflow by phone or e-mail (listed below).  Please update your documentation within the medical record to reflect your response to this query.                                                                                        01/28/11   Dear Dr. [facility],  In a better effort to capture your patient's severity of illness, reflect appropriate length of stay and utilization of resources, a review of the patient medical record has revealed the following indicators.    Based on your clinical judgment, please clarify and document in a progress note and/or discharge summary the clinical condition associated with the following supporting information:  In responding to this query please exercise your independent judgment.  The fact that a query is asked, does not imply that any particular answer is desired or expected.  Pt admitted with several clinical issues, such as chronic hypotension, paraplegia, alcohol abuse, and multiple decubitus (sacral, LE, back, hip)  According to H/P a colonoscopy is planned. Planning clarify the diagnosis necessitating the need for the colonoscopy and document in the pn or d/c summary.   Possible Clinical Conditions?   _______Other Condition__________________ _______Cannot Clinically  Determine   Supporting Information:  Risk Factors: H/O anemia, blood transfusions, Chronic decubs, C1-C4 spinal cord injury. Signs & Symptoms:  Diagnostics:  Treatment Monitoring  You may use possible, probable, or suspect with inpatient documentation. possible, probable, suspected diagnoses MUST be documented at the time of discharge  Reviewed: additional information in medical record  Thank You,  Enis Slipper  RN, BSN, CCDS Clinical Documentation Specialist [facility] Dept Pager: [phone] / E-mail: [email or web address]  Health Information Management Impact

## 2011-01-25 NOTE — Op Note (Signed)
Problem: Unexplained iron deficiency anemia.  Procedure: Aborted colonoscopy  Endoscopist: Danise Edge  Premedication: Propofol administered by anesthesia  Procedure: There is a rectal tube in place draining dark liquid stool. The Pentax colonoscope was introduced into the rectum revealing a large amount of solid stool. He is scheduled for a diagnostic colonoscopy was canceled.  Assessment: Canceled colonoscopy due to inadequate colon preparation.  Plan: Discuss repeat colonoscopy at another date. The patient can be discharged home.

## 2011-01-25 NOTE — H&P (Signed)
Problem: Unexplained iron deficiency anemia  History: The patient is a 56 year old male with unexplained iron deficiency anemia associated with a hemoglobin 8.5 gm, serum iron saturation 9%, and normal serum ferritin the patient denies a history of colorectal neoplasia. There is no family history of colorectal neoplasia. He denies gastrointestinal bleeding, hematuria, or hemoptysis. She denies a history of peptic ulcer disease. He does have a history of alcoholism. He has never undergone colorectal neoplasia screening.  June 2011, the patient sustained a spinal cord injury due to fall in his quadriplegic.  I have reviewed the patient's past medical and surgical history.  Plan: Proceeding with diagnostic esophagogastroduodenoscopy and colonoscopy as scheduled.

## 2011-01-25 NOTE — Transfer of Care (Signed)
Immediate Anesthesia Transfer of Care Note  Patient: [name]  Procedure(s) Performed:  ESOPHAGOGASTRODUODENOSCOPY (EGD) WITH PROPOFOL; COLONOSCOPY WITH PROPOFOL  Patient Location: PACU  Anesthesia Type: General  Level of Consciousness: awake, alert , sedated and patient cooperative  Airway & Oxygen Therapy: Patient Spontanous Breathing and Patient connected to face mask oxygen  Post-op Assessment: Report given to PACU RN and Post -op Vital signs reviewed and stable  Post vital signs: Reviewed and stable  Complications: No apparent anesthesia complications

## 2011-01-25 NOTE — Anesthesia Preprocedure Evaluation (Signed)
Anesthesia Evaluation  Patient identified by MRN, date of birth, ID band Patient awake    Reviewed: Allergy & Precautions, H&P , NPO status , Patient's Chart, lab work & pertinent test results, reviewed documented beta blocker date and time   Airway Mallampati: II  Neck ROM: Limited    Dental  (+) Edentulous Upper and Edentulous Lower   Pulmonary neg pulmonary ROS,  clear to auscultation  + decreased breath sounds      Cardiovascular Regular Normal Denies cardiac symptoms   Neuro/Psych C-spine injury 2011  Negative Psych ROS   GI/Hepatic Neg liver ROS, R/O GI blood loss   Endo/Other  Negative Endocrine ROS  Renal/GU negative Renal ROS  Genitourinary negative   Musculoskeletal negative musculoskeletal ROS (+)   Abdominal   Peds negative pediatric ROS (+)  Hematology Anemia   Anesthesia Other Findings   Reproductive/Obstetrics negative OB ROS                           Anesthesia Physical Anesthesia Plan  ASA: III  Anesthesia Plan: General   Post-op Pain Management:    Induction: Intravenous  Airway Management Planned: Mask  Additional Equipment:   Intra-op Plan:   Post-operative Plan:   Informed Consent:   Plan Discussed with: CRNA and Surgeon  Anesthesia Plan Comments:         Anesthesia Quick Evaluation

## 2011-02-07 ENCOUNTER — Other Ambulatory Visit: Payer: Self-pay | Admitting: Gastroenterology

## 2011-02-07 DIAGNOSIS — D509 Iron deficiency anemia, unspecified: Secondary | ICD-10-CM

## 2011-02-09 ENCOUNTER — Encounter (HOSPITAL_BASED_OUTPATIENT_CLINIC_OR_DEPARTMENT_OTHER): Payer: Medicare HMO

## 2011-02-13 NOTE — Progress Notes (Signed)
[facility]  NAME:  [name], [name]                      ACCOUNT NO.:  MEDICAL RECORD NO.:  [id number]      DATE OF BIRTH:  04/14/1955  PHYSICIAN:  Ardath Sax, M.D.           VISIT DATE:                                  OFFICE VISIT   This gentleman to maintain healing and to prevent any complications from his decubitus, the air fluidized therapy was prescribed, and has been wonderful in promoting healing, and this was done during a period of January 31, 2010, to September 02, 2010.     Ardath Sax, M.D.     PP/MEDQ  D:  02/12/2011  T:  02/12/2011  Job:  [id number]

## 2011-02-15 ENCOUNTER — Other Ambulatory Visit (HOSPITAL_COMMUNITY): Payer: Self-pay | Admitting: Gastroenterology

## 2011-02-15 DIAGNOSIS — D649 Anemia, unspecified: Secondary | ICD-10-CM

## 2011-02-15 DIAGNOSIS — D509 Iron deficiency anemia, unspecified: Secondary | ICD-10-CM

## 2011-02-15 DIAGNOSIS — E611 Iron deficiency: Secondary | ICD-10-CM

## 2011-02-16 ENCOUNTER — Inpatient Hospital Stay: Admission: RE | Admit: 2011-02-16 | Payer: Medicare HMO | Source: Ambulatory Visit

## 2011-02-21 ENCOUNTER — Other Ambulatory Visit (HOSPITAL_COMMUNITY): Payer: Medicare HMO

## 2011-02-21 ENCOUNTER — Telehealth: Payer: Self-pay | Admitting: Physical Medicine & Rehabilitation

## 2011-02-21 MED ORDER — OXYCODONE HCL 5 MG PO CAPS: 5.0000 mg | ORAL_CAPSULE | Freq: Four times a day (QID) | ORAL | Status: DC | PRN

## 2011-02-21 NOTE — Telephone Encounter (Signed)
Needs oxycodone r/f

## 2011-02-21 NOTE — Telephone Encounter (Signed)
yes

## 2011-02-21 NOTE — Telephone Encounter (Signed)
Last seen on 01/16/11. Last fill was on 01/16/11. Okay to refill? Thanks.

## 2011-02-21 NOTE — Telephone Encounter (Signed)
Pt wife aware rx is ready for pick up.

## 2011-02-22 ENCOUNTER — Telehealth: Payer: Self-pay | Admitting: Physical Medicine & Rehabilitation

## 2011-02-22 NOTE — Telephone Encounter (Signed)
The quantity on his Oxycodone had been changed from #90 to #60. The pharmacy didn't catch the change and filled it for #90. I told him it was fine for this one time but from now on it needs to be filled with #90. They would have had to trash the whole RX.

## 2011-02-22 NOTE — Telephone Encounter (Signed)
Pharmacy mistakenly filled prescription for #90, instead of #60.  Will, Dr please OK this r/f for #90?  They will adjust records to show correct qty.

## 2011-02-23 ENCOUNTER — Encounter (HOSPITAL_BASED_OUTPATIENT_CLINIC_OR_DEPARTMENT_OTHER): Payer: Medicare HMO

## 2011-02-27 ENCOUNTER — Other Ambulatory Visit (HOSPITAL_COMMUNITY): Payer: Medicare HMO

## 2011-02-28 ENCOUNTER — Ambulatory Visit (HOSPITAL_COMMUNITY)
Admission: RE | Admit: 2011-02-28 | Discharge: 2011-02-28 | Disposition: A | Discharge status: Home / Self Care | Payer: Medicare HMO | Source: Ambulatory Visit | Attending: Gastroenterology | Admitting: Gastroenterology

## 2011-02-28 ENCOUNTER — Other Ambulatory Visit (HOSPITAL_COMMUNITY): Payer: Self-pay | Admitting: Gastroenterology

## 2011-02-28 DIAGNOSIS — D509 Iron deficiency anemia, unspecified: Secondary | ICD-10-CM

## 2011-02-28 DIAGNOSIS — G8220 Paraplegia, unspecified: Secondary | ICD-10-CM | POA: Insufficient documentation

## 2011-02-28 DIAGNOSIS — Z9089 Acquired absence of other organs: Secondary | ICD-10-CM | POA: Insufficient documentation

## 2011-03-02 ENCOUNTER — Encounter (HOSPITAL_BASED_OUTPATIENT_CLINIC_OR_DEPARTMENT_OTHER): Payer: Medicare HMO | Attending: General Surgery

## 2011-03-02 DIAGNOSIS — L89609 Pressure ulcer of unspecified heel, unspecified stage: Secondary | ICD-10-CM | POA: Insufficient documentation

## 2011-03-02 DIAGNOSIS — L89109 Pressure ulcer of unspecified part of back, unspecified stage: Secondary | ICD-10-CM | POA: Insufficient documentation

## 2011-03-02 DIAGNOSIS — L89899 Pressure ulcer of other site, unspecified stage: Secondary | ICD-10-CM | POA: Insufficient documentation

## 2011-03-02 DIAGNOSIS — L89209 Pressure ulcer of unspecified hip, unspecified stage: Secondary | ICD-10-CM | POA: Insufficient documentation

## 2011-03-02 DIAGNOSIS — L8990 Pressure ulcer of unspecified site, unspecified stage: Secondary | ICD-10-CM | POA: Insufficient documentation

## 2011-03-02 DIAGNOSIS — Z8614 Personal history of Methicillin resistant Staphylococcus aureus infection: Secondary | ICD-10-CM | POA: Insufficient documentation

## 2011-03-02 DIAGNOSIS — Z79899 Other long term (current) drug therapy: Secondary | ICD-10-CM | POA: Insufficient documentation

## 2011-03-26 ENCOUNTER — Telehealth: Payer: Self-pay | Admitting: Physical Medicine & Rehabilitation

## 2011-03-26 MED ORDER — OXYMORPHONE HCL ER 20 MG PO TB12: 20.0000 mg | ORAL_TABLET | Freq: Two times a day (BID) | ORAL | Status: DC | PRN

## 2011-03-26 MED ORDER — OXYCODONE HCL 5 MG PO CAPS: 5.0000 mg | ORAL_CAPSULE | Freq: Four times a day (QID) | ORAL | Status: DC | PRN

## 2011-03-26 NOTE — Telephone Encounter (Signed)
May refill 

## 2011-03-26 NOTE — Telephone Encounter (Signed)
Patient needs refill on Oxycodone and the other pain medication.

## 2011-03-26 NOTE — Telephone Encounter (Signed)
Pt wife aware rx are ready for pick up. 

## 2011-03-26 NOTE — Telephone Encounter (Signed)
Pt was last seen on 01/16/11. Was given a refill on Oxycodone 5mg  1 Q 6hrs PRN #90 and Opana ER 20mg  1 Q 12hrs #60 on 01/16/11. Okay to refill?

## 2011-04-12 ENCOUNTER — Other Ambulatory Visit: Payer: Self-pay | Admitting: *Deleted

## 2011-04-12 MED ORDER — GABAPENTIN 600 MG PO TABS: 600.0000 mg | ORAL_TABLET | Freq: Three times a day (TID) | ORAL | Status: DC

## 2011-04-19 ENCOUNTER — Telehealth: Payer: Self-pay | Admitting: *Deleted

## 2011-04-19 ENCOUNTER — Encounter: Payer: Self-pay | Admitting: *Deleted

## 2011-04-19 IMAGING — CR DG CHEST 1V PORT
1 series · 1 of 1 positions shown · non-contrast
Comparison: 07/17/2009

CLINICAL DATA: Fall, ventilator dependent respiratory failure.
Spinal cord injury.

PORTABLE CHEST - 1 VIEW

[view not recorded]
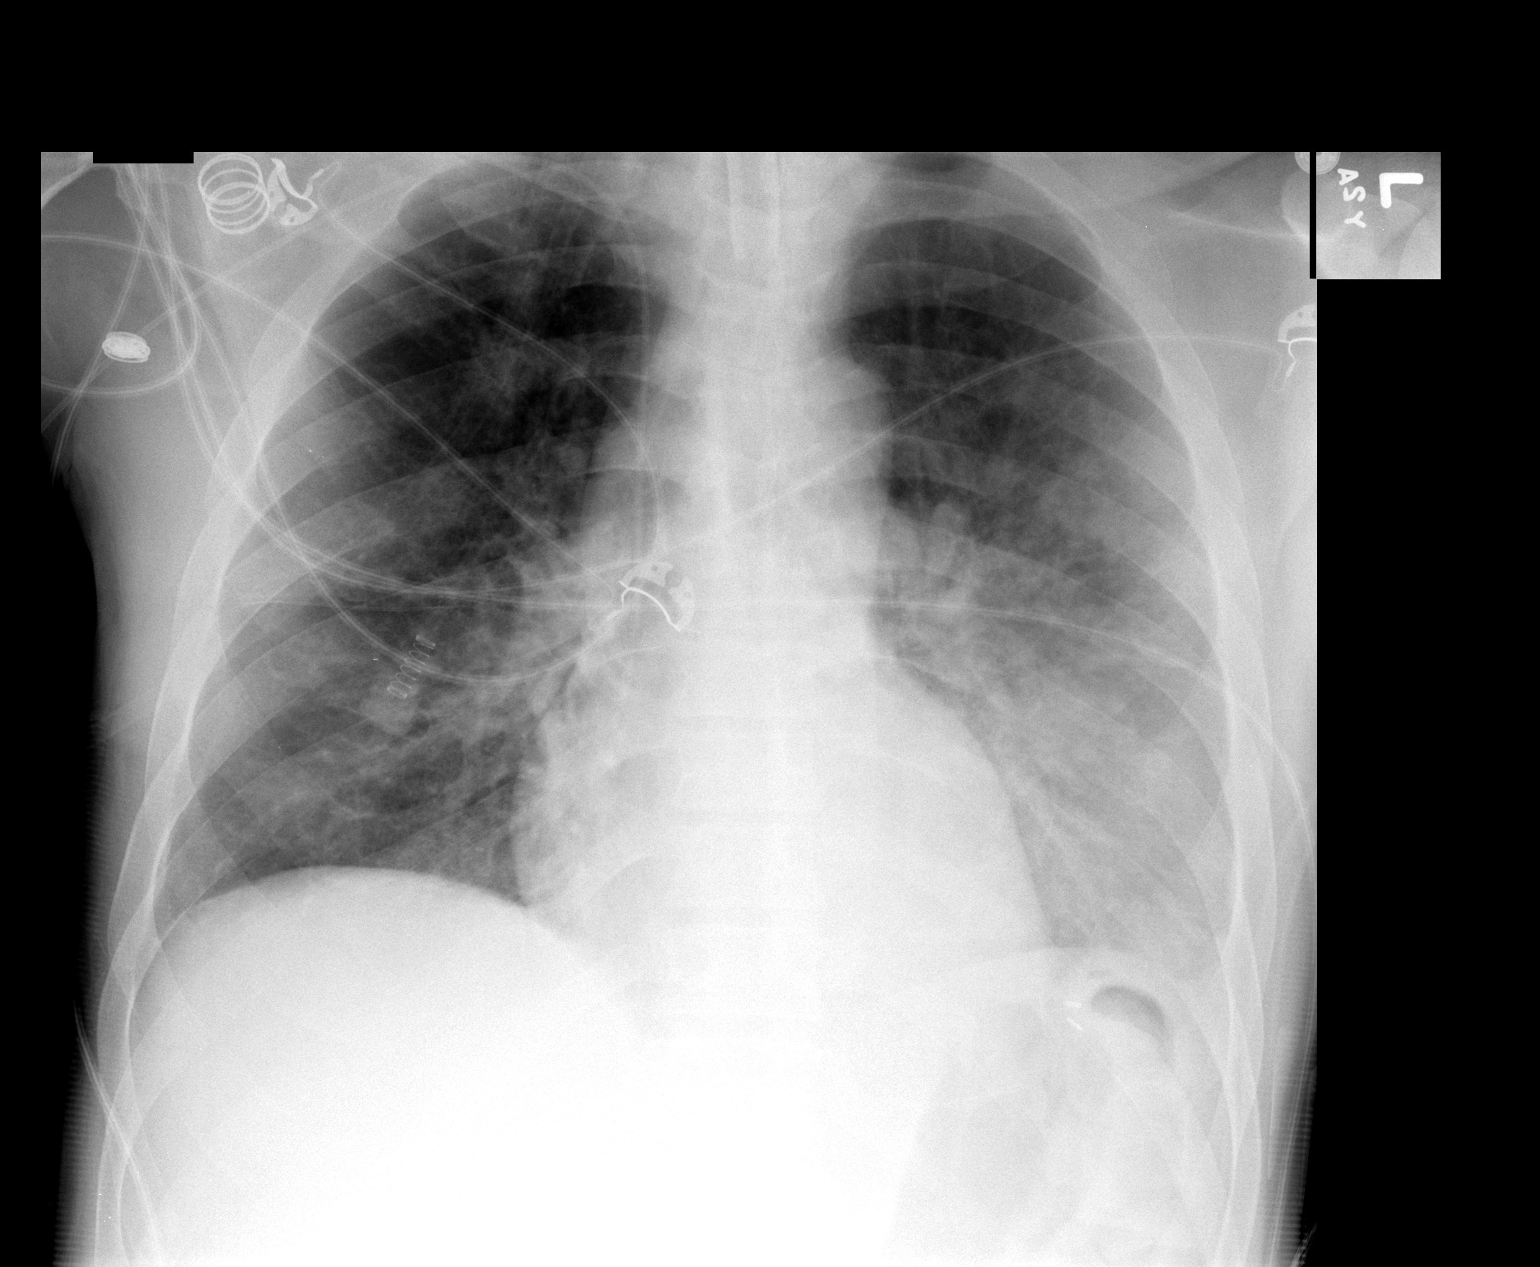

[1 of 1 positions shown; findings below may reference images not displayed]

FINDINGS: Tracheostomy is midline.  Right PICC tip projects over
the lower SVC, near the SVC/RA junction.  Heart size normal.
Bilateral lower lobe air space disease, left greater than right,
with slight worsening from 07/17/2009.
IMPRESSION: Bilateral lower lobe air space disease, left greater than right,
increased from 07/17/2009.

## 2011-04-19 NOTE — Progress Notes (Signed)
Documented on wrong pt. Disregard telephone note re: CT scan, written on 04/19/11, 0909 am.

## 2011-04-19 NOTE — Telephone Encounter (Addendum)
Returned call from pt who states he missed his CT scan earlier this month, needs to reschedule. Pt states he has to rely on girlfriend for transportation, and she cannot drive x 4 weeks. Informed pt will route his request to Jacolyn Reedy, nursing secretary to reschedule him. Also informed pt secretary is out of office until next Monday, but will call him then. Pt verbalized understanding, agreement.

## 2011-04-23 IMAGING — CR DG CHEST 1V PORT
1 series · 1 of 1 positions shown · non-contrast
Comparison: 07/21/2009.

CLINICAL DATA: Fall.  Spinal cord injury.

PORTABLE CHEST - 1 VIEW

[view not recorded]
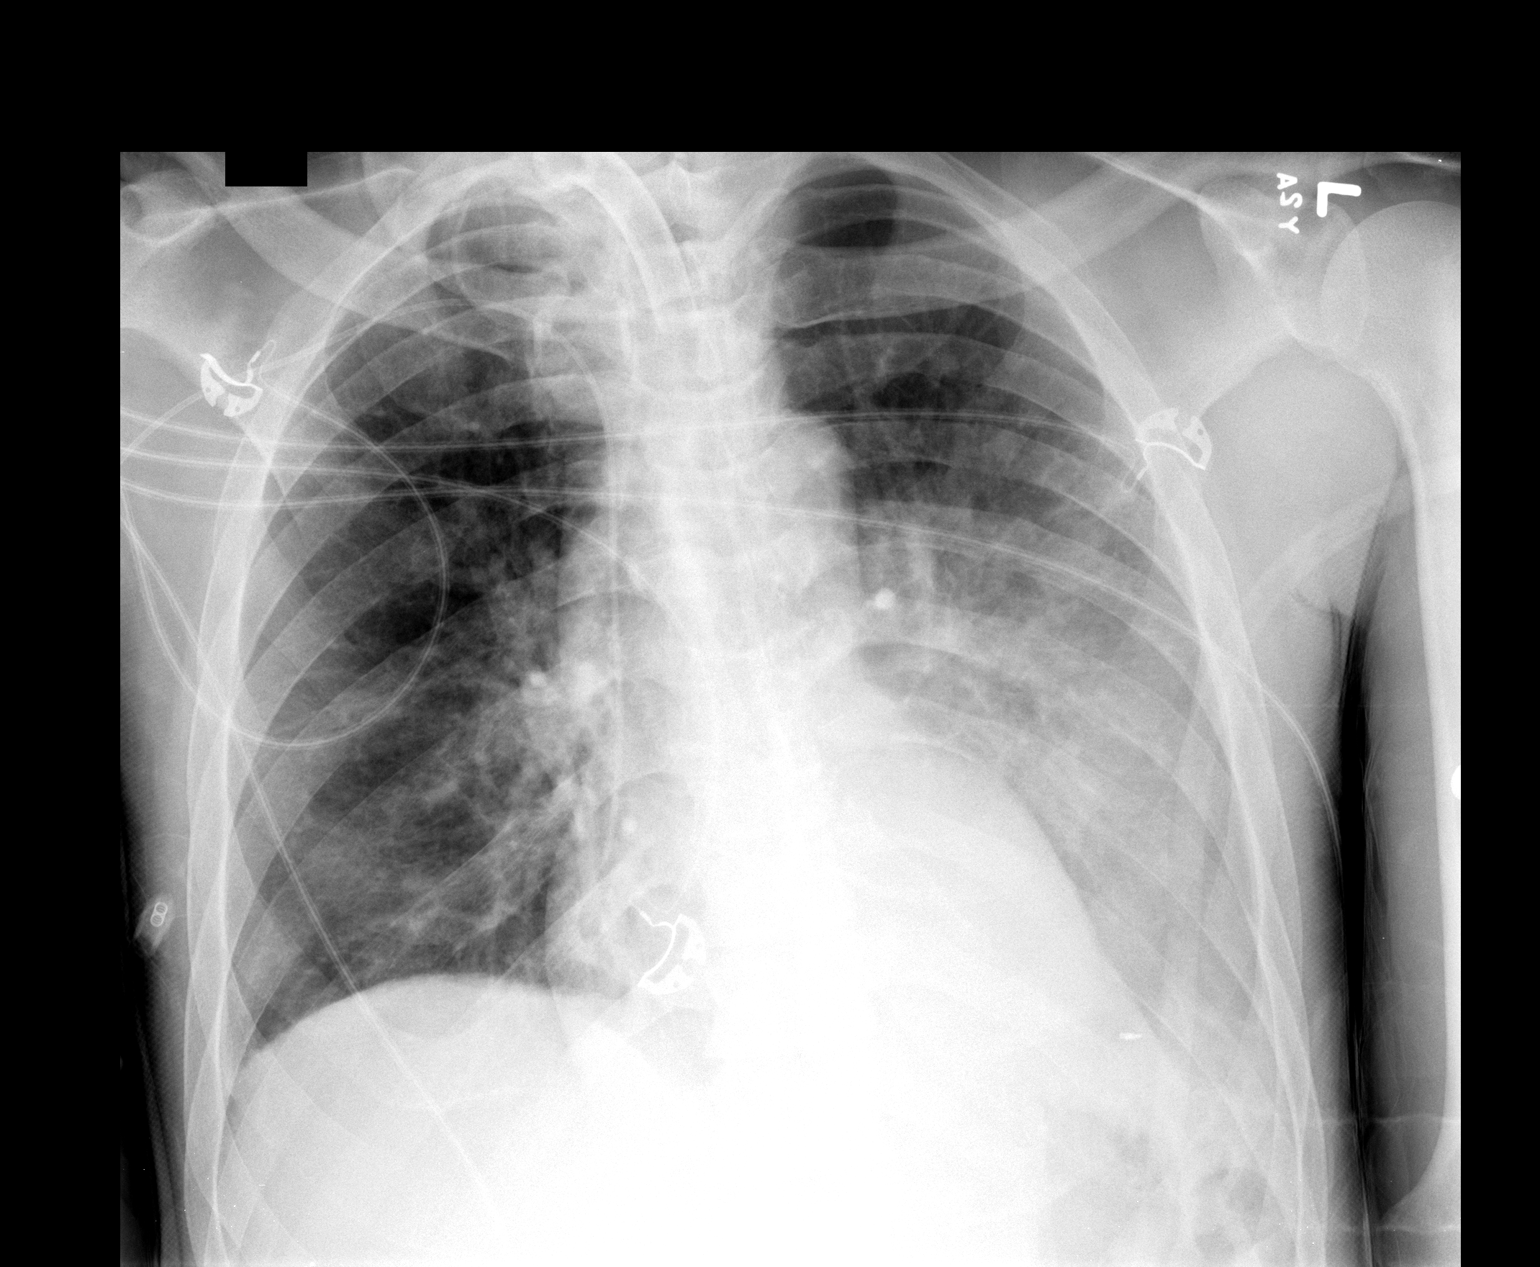

[1 of 1 positions shown; findings below may reference images not displayed]

FINDINGS: Tracheostomy tube tip midline.  Right PICC line tip
proximal right atrium.  To be within the distal superior vena cava,
this can be retracted by 3 cm.  No gross pneumothorax.

Hazy consolidation left mid to lower lung zone may be partially
explained by a posteriorly layering pleural fluid and atelectasis
but limits detection of underlying infiltrate or mass.  Pulmonary
vascular congestion.
IMPRESSION: Hazy appearance left mid to lower lung zone.  Question pleural
fluid and atelectasis.

Pulmonary vascular prominence.

Right PICC line tip proximal right atrium.

## 2011-05-01 ENCOUNTER — Encounter: Payer: Self-pay | Admitting: Physical Medicine & Rehabilitation

## 2011-05-01 ENCOUNTER — Encounter: Payer: Medicare HMO | Attending: Physical Medicine & Rehabilitation | Admitting: Physical Medicine & Rehabilitation

## 2011-05-01 VITALS — BP 108/49 | HR 80 | Resp 16 | Ht 70.0 in | Wt 125.0 lb

## 2011-05-01 DIAGNOSIS — IMO0002 Reserved for concepts with insufficient information to code with codable children: Secondary | ICD-10-CM | POA: Insufficient documentation

## 2011-05-01 DIAGNOSIS — S14105A Unspecified injury at C5 level of cervical spinal cord, initial encounter: Secondary | ICD-10-CM

## 2011-05-01 DIAGNOSIS — G8250 Quadriplegia, unspecified: Secondary | ICD-10-CM

## 2011-05-01 DIAGNOSIS — M7500 Adhesive capsulitis of unspecified shoulder: Secondary | ICD-10-CM | POA: Insufficient documentation

## 2011-05-01 DIAGNOSIS — K592 Neurogenic bowel, not elsewhere classified: Secondary | ICD-10-CM | POA: Insufficient documentation

## 2011-05-01 DIAGNOSIS — X58XXXS Exposure to other specified factors, sequela: Secondary | ICD-10-CM | POA: Insufficient documentation

## 2011-05-01 DIAGNOSIS — N319 Neuromuscular dysfunction of bladder, unspecified: Secondary | ICD-10-CM | POA: Insufficient documentation

## 2011-05-01 MED ORDER — OXYMORPHONE HCL ER 20 MG PO TB12: 20.0000 mg | ORAL_TABLET | Freq: Two times a day (BID) | ORAL | Status: DC | PRN

## 2011-05-01 MED ORDER — OXYCODONE HCL 5 MG PO CAPS: 5.0000 mg | ORAL_CAPSULE | Freq: Four times a day (QID) | ORAL | Status: DC | PRN

## 2011-05-01 NOTE — Patient Instructions (Signed)
More aggressive range of motion exercises are needed.  You need to stretch past the point of resistance and hold the position for at least 20-30 seconds.  Stretching needs to happen at least 2 or 3 times per day

## 2011-05-01 NOTE — Progress Notes (Signed)
Botox Injection for spasticity using needle EMG guidance  Dilution: 100 Units/ml Indication: Severe spasticity which interferes with ADL,mobility and/or  hygiene and is unresponsive to medication management and other conservative care Informed consent was obtained after describing risks and benefits of the procedure with the patient. This includes bleeding, bruising, infection, excessive weakness, or medication side effects. A REMS form is on file and signed. Needle: 50mm 25g needle electrode Number of units per muscle Pectoralis Major right 50 u       And left  50 u Pec Minor right  50 u          And left 50 u Biceps left 50 u, right 50 u Teres major left 50 u, right 50 u   All injections were done after obtaining appropriate EMG activity and after negative drawback for blood. The patient tolerated the procedure well. Post procedure instructions were given. A followup appointment was made.  Botox Injection for spasticity using needle EMG guidance  Narcotics were refilled See PA back in about a month. I'll see him in 2 months    ASSESSMENT:  1. 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  2. Adhesive capsulitis of the bilateral shoulders.  3. Neurogenic bowel and bladder.  4. Wound care issues.   PLAN:  3. Refilled his oxycodone 5 mg, #90, 1 q.6 h. p.r.n. and Opana ER 20  mg, #90, 1 q.12 h. schedule.  4. He may follow up with my PA in one month.

## 2011-05-04 ENCOUNTER — Other Ambulatory Visit: Payer: Self-pay | Admitting: *Deleted

## 2011-05-04 MED ORDER — GABAPENTIN 600 MG PO TABS: 600.0000 mg | ORAL_TABLET | Freq: Three times a day (TID) | ORAL | Status: DC

## 2011-05-04 MED ORDER — BACLOFEN 20 MG PO TABS: 20.0000 mg | ORAL_TABLET | Freq: Four times a day (QID) | ORAL | Status: DC

## 2011-05-11 ENCOUNTER — Encounter (HOSPITAL_BASED_OUTPATIENT_CLINIC_OR_DEPARTMENT_OTHER): Payer: Medicare HMO | Attending: General Surgery

## 2011-05-11 DIAGNOSIS — L89899 Pressure ulcer of other site, unspecified stage: Secondary | ICD-10-CM | POA: Insufficient documentation

## 2011-05-11 DIAGNOSIS — L89609 Pressure ulcer of unspecified heel, unspecified stage: Secondary | ICD-10-CM | POA: Insufficient documentation

## 2011-05-11 DIAGNOSIS — L89109 Pressure ulcer of unspecified part of back, unspecified stage: Secondary | ICD-10-CM | POA: Insufficient documentation

## 2011-05-11 DIAGNOSIS — L8992 Pressure ulcer of unspecified site, stage 2: Secondary | ICD-10-CM | POA: Insufficient documentation

## 2011-05-31 ENCOUNTER — Encounter: Payer: Medicare HMO | Attending: Physical Medicine & Rehabilitation | Admitting: Physical Medicine and Rehabilitation

## 2011-05-31 ENCOUNTER — Encounter: Payer: Self-pay | Admitting: Physical Medicine and Rehabilitation

## 2011-05-31 VITALS — BP 104/55 | HR 87 | Resp 16

## 2011-05-31 DIAGNOSIS — L89109 Pressure ulcer of unspecified part of back, unspecified stage: Secondary | ICD-10-CM | POA: Insufficient documentation

## 2011-05-31 DIAGNOSIS — G8220 Paraplegia, unspecified: Secondary | ICD-10-CM | POA: Insufficient documentation

## 2011-05-31 DIAGNOSIS — S14105A Unspecified injury at C5 level of cervical spinal cord, initial encounter: Secondary | ICD-10-CM

## 2011-05-31 DIAGNOSIS — G8929 Other chronic pain: Secondary | ICD-10-CM | POA: Insufficient documentation

## 2011-05-31 DIAGNOSIS — L8990 Pressure ulcer of unspecified site, unspecified stage: Secondary | ICD-10-CM | POA: Insufficient documentation

## 2011-05-31 DIAGNOSIS — M25519 Pain in unspecified shoulder: Secondary | ICD-10-CM | POA: Insufficient documentation

## 2011-05-31 DIAGNOSIS — X58XXXA Exposure to other specified factors, initial encounter: Secondary | ICD-10-CM | POA: Insufficient documentation

## 2011-05-31 DIAGNOSIS — S14101A Unspecified injury at C1 level of cervical spinal cord, initial encounter: Secondary | ICD-10-CM | POA: Insufficient documentation

## 2011-05-31 DIAGNOSIS — G8250 Quadriplegia, unspecified: Secondary | ICD-10-CM | POA: Insufficient documentation

## 2011-05-31 DIAGNOSIS — M7500 Adhesive capsulitis of unspecified shoulder: Secondary | ICD-10-CM | POA: Insufficient documentation

## 2011-05-31 DIAGNOSIS — M542 Cervicalgia: Secondary | ICD-10-CM | POA: Insufficient documentation

## 2011-05-31 MED ORDER — OXYMORPHONE HCL ER 20 MG PO TB12: 20.0000 mg | ORAL_TABLET | Freq: Two times a day (BID) | ORAL | Status: DC | PRN

## 2011-05-31 MED ORDER — OXYCODONE HCL 5 MG PO CAPS: 5.0000 mg | ORAL_CAPSULE | Freq: Four times a day (QID) | ORAL | Status: DC | PRN

## 2011-05-31 NOTE — Progress Notes (Signed)
Subjective:    Patient ID: [name], male    DOB: October 17, 1955, 56 y.o.   MRN: [id number]  HPI The patient complains about chronic pain in his neck and shoulders.  The problem has been stable.  He reports that his sacral decubitus ulcer has improved, and that home health is coming to his house to take care of those wounds. Discussed the importance of passive movement of both shoulders with the patient's wife. I also showed her some exercises and techniques to improve the range of motion of both shoulders.  Pain Inventory Average Pain 5 Pain Right Now 5 My pain is aching  In the last 24 hours, has pain interfered with the following? General activity 7 Relation with others 9 Enjoyment of life 7 What TIME of day is your pain at its worst? evening Sleep (in general) Fair  Pain is worse with: unsure Pain improves with: medication Relief from Meds: 7  Mobility ability to climb steps?  no do you drive?  no use a wheelchair  Function I need assistance with the following:  feeding, dressing, bathing and toileting  Neuro/Psych No problems in this area  Prior Studies Any changes since last visit?  no  Physicians involved in your care Any changes since last visit?  no   History reviewed. No pertinent family history. History   Social History  . Marital Status: Married    Spouse Name: N/A    Number of Children: N/A  . Years of Education: N/A   Social History Main Topics  . Smoking status: Former Smoker    Quit date: 07/23/2009  . Smokeless tobacco: Never Used  . Alcohol Use: No  . Drug Use: No  . Sexually Active: None   Other Topics Concern  . None   Social History Narrative  . None   Past Surgical History  Procedure Date  . Back surgery    Past Medical History  Diagnosis Date  . C1-C4 level spinal cord injury with other specified spinal cord injury, without evidence of spinal bone injury   . Chronic ulcer of skin   . Neurogenic bowel   . Neurogenic  bladder   . Congenital quadriplegia   . Anemia   . Asthma   . Blood transfusion    BP 104/55  Pulse 87  Resp 16  SpO2 99%      Review of Systems  Constitutional: Negative.   HENT: Negative.   Eyes: Negative.   Respiratory: Negative.   Cardiovascular: Negative.   Gastrointestinal: Negative.   Genitourinary: Negative.   Musculoskeletal: Negative.   Skin: Negative.   Neurological: Negative.   Hematological: Negative.   Psychiatric/Behavioral: Negative.        Objective:   Physical Exam  Constitutional: He is oriented to person, place, and time. He appears well-developed.       Patient is in a wheelchair and paraplegic  HENT:  Head: Normocephalic.  Eyes: Pupils are equal, round, and reactive to light.  Neurological: He is alert and oriented to person, place, and time.  Skin: Skin is dry.  Psychiatric: He has a normal mood and affect.   Increased motor tone is noted in UE bilateral, worse on the right.Spacisity in UEs . Muscle strength about 2-3/5 in upper extremity, except bilateral hands 1-2/5. LEs 2-3/5 Full range of motion in upper and lower extremities. ROM restricted in UEs bilateral, especially in Abduction 50 degrees on the right , 40 degrees on the left, passive and external rotation 5-10  degrees passive. of spine is not restricted. Fine motor movements are not possible in both hands.  Patient in wheel chair.      Assessment & Plan:  This is a 56 year old  male with 1. C5 spinal cord injury in 2011 2. Paraplegic 3. shoulder capsulitis 4. Quatroplegic Plan : Continue with medication. Showed patient and his wife some exercises and techniques to improve the movement of the shoulders. Follow up in one month.

## 2011-05-31 NOTE — Patient Instructions (Signed)
Continue with medication, showed patient and his wife some techniques to improve movement of UE.

## 2011-06-15 ENCOUNTER — Encounter (HOSPITAL_BASED_OUTPATIENT_CLINIC_OR_DEPARTMENT_OTHER): Payer: Medicare HMO | Attending: General Surgery

## 2011-06-15 DIAGNOSIS — L89609 Pressure ulcer of unspecified heel, unspecified stage: Secondary | ICD-10-CM | POA: Insufficient documentation

## 2011-06-15 DIAGNOSIS — L8992 Pressure ulcer of unspecified site, stage 2: Secondary | ICD-10-CM | POA: Insufficient documentation

## 2011-06-15 DIAGNOSIS — L89109 Pressure ulcer of unspecified part of back, unspecified stage: Secondary | ICD-10-CM | POA: Insufficient documentation

## 2011-06-15 DIAGNOSIS — L89899 Pressure ulcer of other site, unspecified stage: Secondary | ICD-10-CM | POA: Insufficient documentation

## 2011-06-22 IMAGING — CR DG SHOULDER 2+V*R*
3 series · 3 of 3 positions shown · non-contrast
Comparison: None.

CLINICAL DATA: Right shoulder pain.

RIGHT SHOULDER - 2+ VIEW

[view not recorded (1 of 3)]
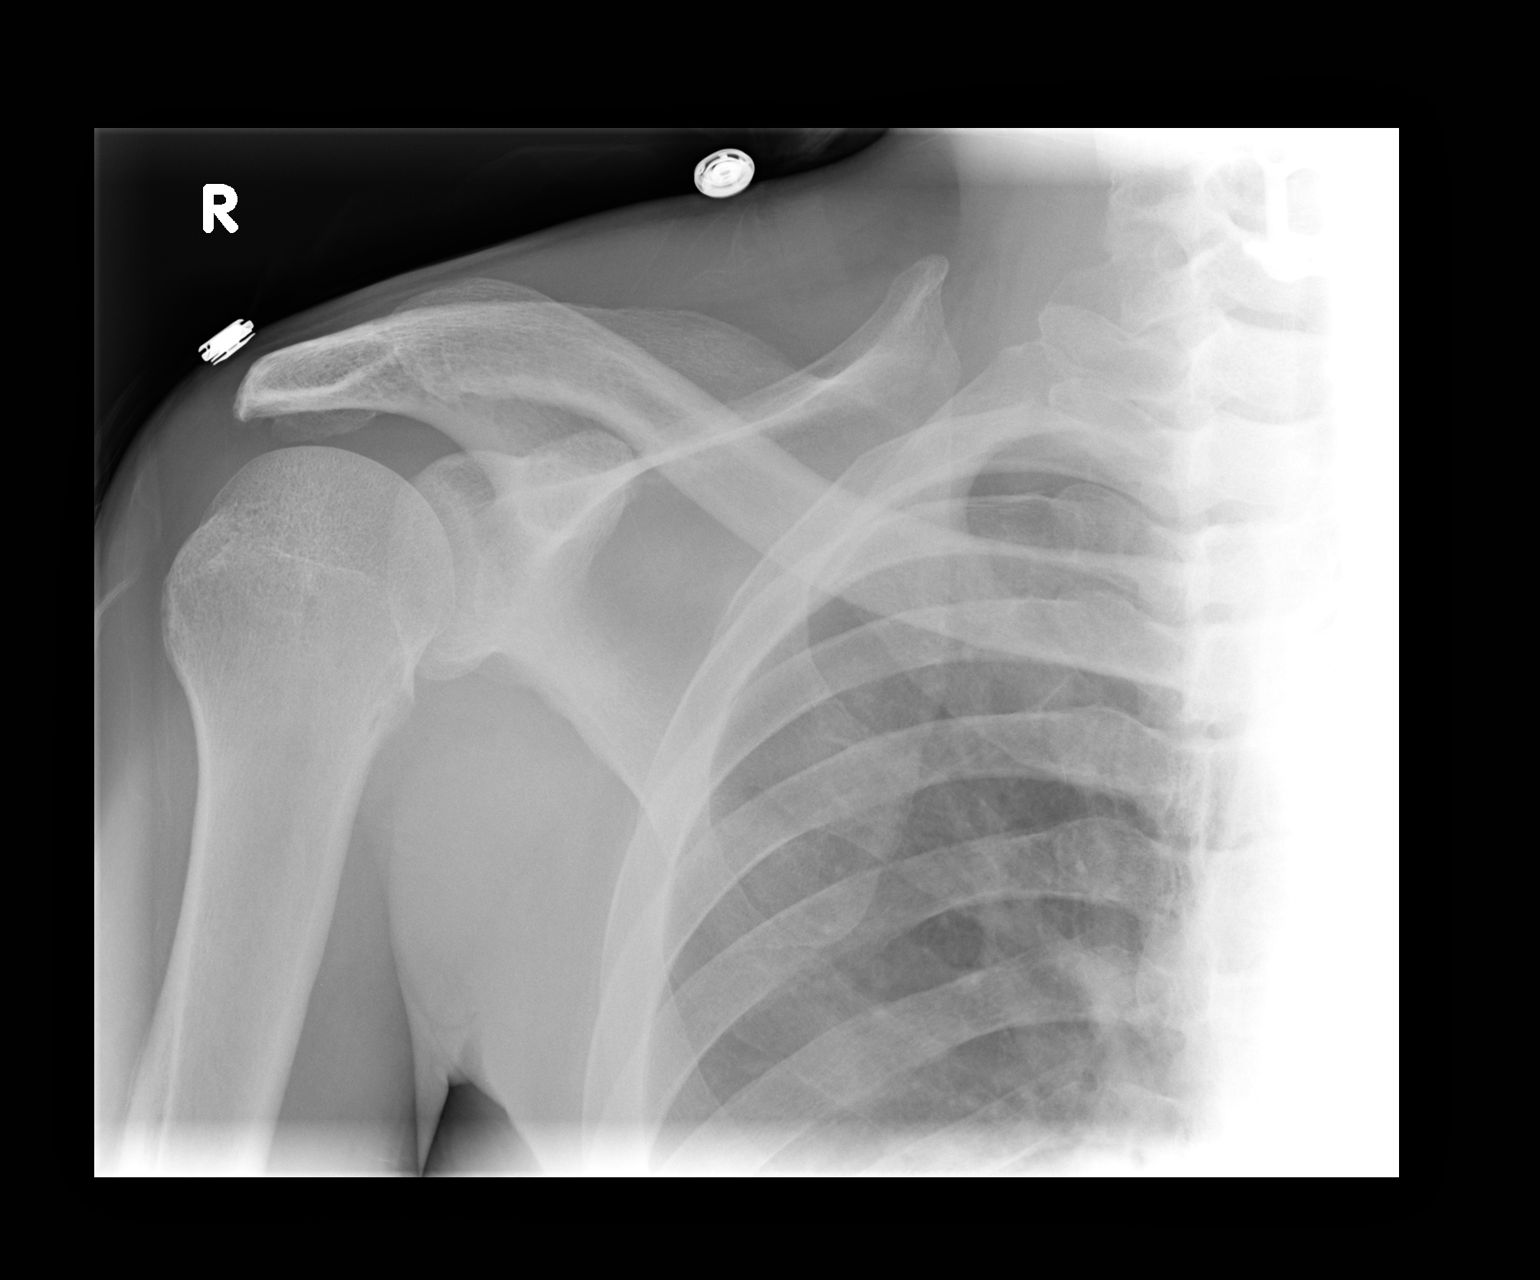

[view not recorded (2 of 3)]
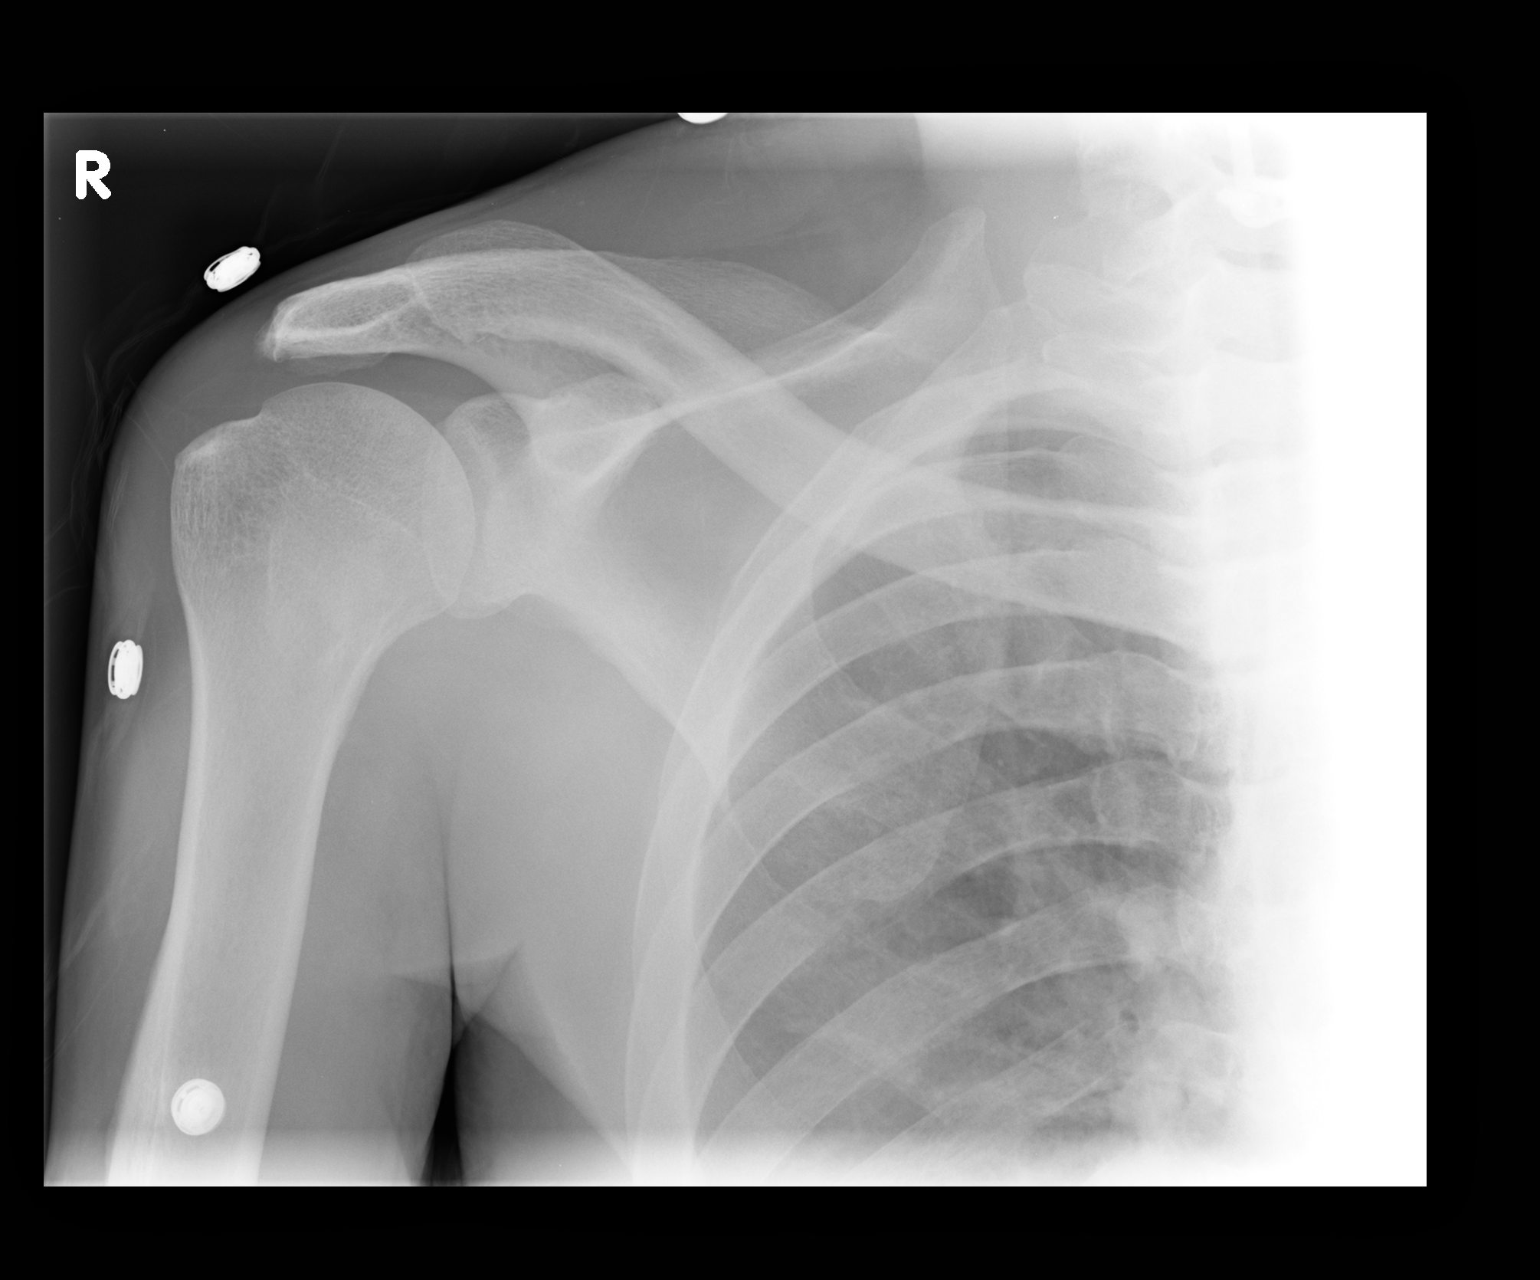

[view not recorded (3 of 3)]
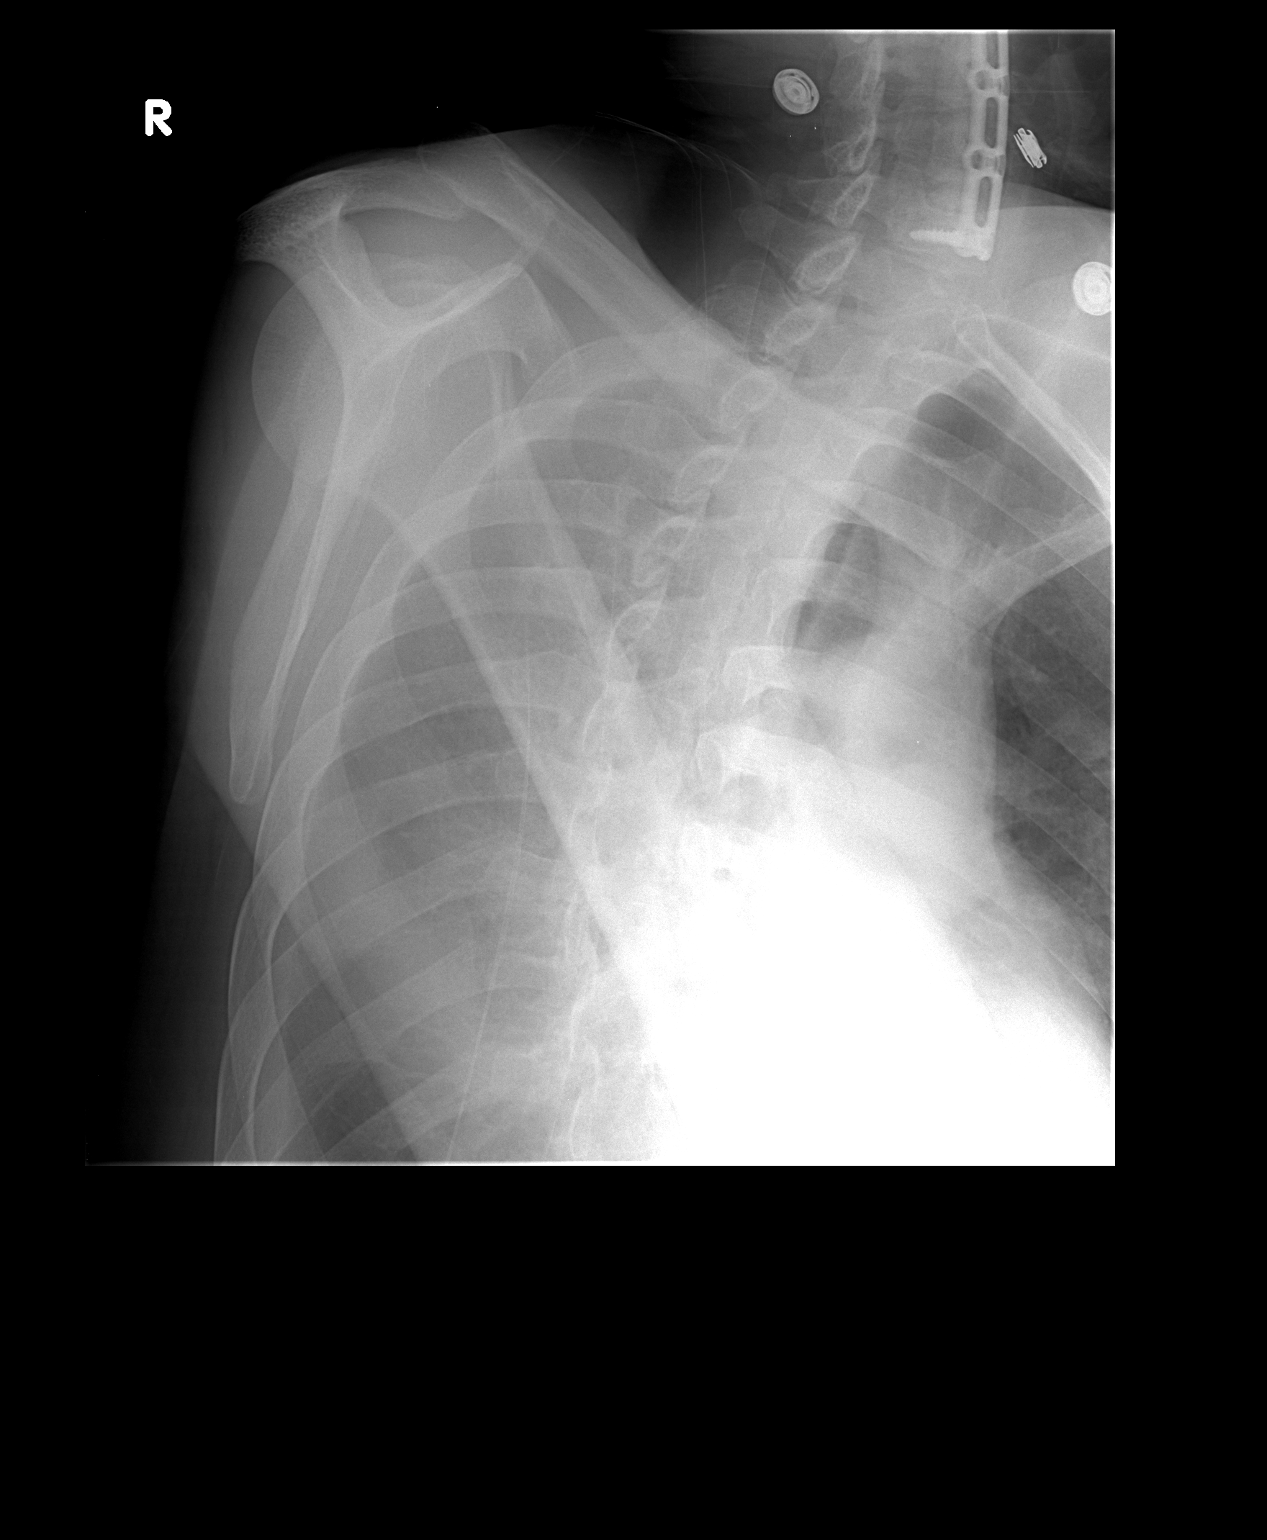

[3 of 3 positions shown; findings below may reference images not displayed]

FINDINGS: No fracture or dislocation. Mild curvature of the
anterior aspect of the acromion. Visualized lungs clear.
IMPRESSION: No fracture or dislocation.

## 2011-06-29 ENCOUNTER — Encounter: Payer: Medicare HMO | Attending: Physical Medicine & Rehabilitation | Admitting: Physical Medicine and Rehabilitation

## 2011-06-29 ENCOUNTER — Encounter: Payer: Self-pay | Admitting: Physical Medicine and Rehabilitation

## 2011-06-29 VITALS — BP 71/47 | HR 76 | Resp 14 | Ht 70.0 in | Wt 125.0 lb

## 2011-06-29 DIAGNOSIS — G8929 Other chronic pain: Secondary | ICD-10-CM | POA: Insufficient documentation

## 2011-06-29 DIAGNOSIS — G8250 Quadriplegia, unspecified: Secondary | ICD-10-CM

## 2011-06-29 DIAGNOSIS — M7500 Adhesive capsulitis of unspecified shoulder: Secondary | ICD-10-CM | POA: Insufficient documentation

## 2011-06-29 DIAGNOSIS — S14105A Unspecified injury at C5 level of cervical spinal cord, initial encounter: Secondary | ICD-10-CM

## 2011-06-29 DIAGNOSIS — G8220 Paraplegia, unspecified: Secondary | ICD-10-CM | POA: Insufficient documentation

## 2011-06-29 DIAGNOSIS — X58XXXS Exposure to other specified factors, sequela: Secondary | ICD-10-CM | POA: Insufficient documentation

## 2011-06-29 DIAGNOSIS — M542 Cervicalgia: Secondary | ICD-10-CM | POA: Insufficient documentation

## 2011-06-29 DIAGNOSIS — M25519 Pain in unspecified shoulder: Secondary | ICD-10-CM | POA: Insufficient documentation

## 2011-06-29 DIAGNOSIS — IMO0002 Reserved for concepts with insufficient information to code with codable children: Secondary | ICD-10-CM | POA: Insufficient documentation

## 2011-06-29 MED ORDER — OXYMORPHONE HCL ER 20 MG PO TB12: 20.0000 mg | ORAL_TABLET | Freq: Two times a day (BID) | ORAL | Status: DC | PRN

## 2011-06-29 MED ORDER — OXYCODONE HCL 5 MG PO CAPS: 5.0000 mg | ORAL_CAPSULE | Freq: Four times a day (QID) | ORAL | Status: DC | PRN

## 2011-06-29 NOTE — Patient Instructions (Addendum)
Continue with exercising with the help of your wife. Restart PT as soon as ulcers allow.Apply heating pad to your tense neck muscles.

## 2011-06-29 NOTE — Progress Notes (Deleted)
Subjective:    Patient ID: [name], male    DOB: 17-May-1955, 56 y.o.   MRN: [id number]  HPI The patient complains about chronic pain in his neck and shoulders.  The problem has been stable. He reports that his sacral decubitus ulcer has improved, and that home health is coming to his house to take care of those wounds. Discussed the importance of passive movement of both shoulders with the patient's wife. I also showed her some exercises and techniques again to improve the range of motion of both shoulders, and stretching of the achilles tendons.   Pain Inventory Average Pain 5 Pain Right Now 5 My pain is intermittent  In the last 24 hours, has pain interfered with the following? General activity 6 Relation with others 5 Enjoyment of life 5 What TIME of day is your pain at its worst? evening Sleep (in general) Good  Pain is worse with: some activites Pain improves with: therapy/exercise and medication Relief from Meds: 7  Mobility ability to climb steps?  no do you drive?  no transfers alone Do you have any goals in this area?  yes  Function disabled: date disabled 2008 I need assistance with the following:  feeding, dressing and toileting Do you have any goals in this area?  yes  Neuro/Psych spasms  Prior Studies Any changes since last visit?  no  Physicians involved in your care Any changes since last visit?  no   History reviewed. No pertinent family history. History   Social History  . Marital Status: Married    Spouse Name: N/A    Number of Children: N/A  . Years of Education: N/A   Social History Main Topics  . Smoking status: Former Smoker    Quit date: 07/23/2009  . Smokeless tobacco: Never Used  . Alcohol Use: No  . Drug Use: No  . Sexually Active: None   Other Topics Concern  . None   Social History Narrative  . None   Past Surgical History  Procedure Date  . Back surgery    Past Medical History  Diagnosis Date  . C1-C4 level  spinal cord injury with other specified spinal cord injury, without evidence of spinal bone injury   . Chronic ulcer of skin   . Neurogenic bowel   . Neurogenic bladder   . Congenital quadriplegia   . Anemia   . Asthma   . Blood transfusion    BP 71/47  Pulse 76  Resp 14  Ht 5\' 10"  (1.778 m)  Wt 125 lb (56.7 kg)  BMI 17.94 kg/m2  SpO2 98%     Review of Systems  Musculoskeletal: Positive for joint swelling, arthralgias and gait problem.  All other systems reviewed and are negative.       Objective:   Physical Exam Constitutional: He is oriented to person, place, and time. He appears well-developed.  Patient is in a wheelchair and paraplegic  HENT:  Head: Normocephalic.  Eyes: Pupils are equal, round, and reactive to light.  Neurological: He is alert and oriented to person, place, and time.  Skin: Skin is dry.  Psychiatric: He has a normal mood and affect.   Increased motor tone is noted in UE bilateral, worse on the right.Spacisity in UEs . Muscle strength about 2-3/5 in upper extremity, except bilateral hands 1-2/5. LEs 2-3/5 Full range of motion in upper and lower extremities. ROM restricted in UEs bilateral, especially in Abduction 50 degrees on the right , 40 degrees on  the left, passive and external rotation 5-10 degrees passive. of spine is not restricted. Fine motor movements are not possible in both hands.  Patient in wheel chair.        Assessment & Plan:  This is a 56 year old male with  1. C5 spinal cord injury in 2011  2. Paraplegic  3. shoulder capsulitis  4. Quatroplegic  Plan :  Continue with medication. Showed patient and his wife some exercises and techniques to improve the movement of the shoulders, and stretching of achilles tendons. I asked patient's wife to apply a heating pad on the patient's neck which has helped him in the past to relax his neck muscles, also showed her how to massage his neck with a tennis ball. Advised patient to his  restart physical therapy as soon as the healing of his ulcer allows. Might consider the repeat Botox injection in the future. Follow up in one month.

## 2011-07-11 ENCOUNTER — Other Ambulatory Visit: Payer: Self-pay | Admitting: Physical Medicine & Rehabilitation

## 2011-07-24 NOTE — Progress Notes (Signed)
Subjective:    Patient ID: [name], male    DOB: 09-19-1955, 56 y.o.   MRN: [id number]  Shoulder Pain    The patient complains about chronic pain in his neck and shoulders.  The problem has been stable. He reports that his sacral decubitus ulcer has improved, and that home health is coming to his house to take care of those wounds. Discussed the importance of passive movement of both shoulders with the patient's wife. I also showed her some exercises and techniques again to improve the range of motion of both shoulders, and stretching of the achilles tendons.   Pain Inventory Average Pain 5 Pain Right Now 5 My pain is intermittent  In the last 24 hours, has pain interfered with the following? General activity 6 Relation with others 5 Enjoyment of life 5 What TIME of day is your pain at its worst? evening Sleep (in general) Good  Pain is worse with: some activites Pain improves with: therapy/exercise and medication Relief from Meds: 7  Mobility ability to climb steps?  no do you drive?  no transfers alone Do you have any goals in this area?  yes  Function disabled: date disabled 2008 I need assistance with the following:  feeding, dressing and toileting Do you have any goals in this area?  yes  Neuro/Psych spasms  Prior Studies Any changes since last visit?  no  Physicians involved in your care Any changes since last visit?  no   History reviewed. No pertinent family history. History   Social History  . Marital Status: Married    Spouse Name: N/A    Number of Children: N/A  . Years of Education: N/A   Social History Main Topics  . Smoking status: Former Smoker    Quit date: 07/23/2009  . Smokeless tobacco: Never Used  . Alcohol Use: No  . Drug Use: No  . Sexually Active: None   Other Topics Concern  . None   Social History Narrative  . None   Past Surgical History  Procedure Date  . Back surgery    Past Medical History  Diagnosis Date    . C1-C4 level spinal cord injury with other specified spinal cord injury, without evidence of spinal bone injury   . Chronic ulcer of skin   . Neurogenic bowel   . Neurogenic bladder   . Congenital quadriplegia   . Anemia   . Asthma   . Blood transfusion    BP 71/47  Pulse 76  Resp 14  Ht 5\' 10"  (1.778 m)  Wt 125 lb (56.7 kg)  BMI 17.94 kg/m2  SpO2 98%     Review of Systems  Musculoskeletal: Positive for joint swelling, arthralgias and gait problem.  All other systems reviewed and are negative.       Objective:   Physical Exam Constitutional: He is oriented to person, place, and time. He appears well-developed.  Patient is in a wheelchair and paraplegic  HENT:  Head: Normocephalic.  Eyes: Pupils are equal, round, and reactive to light.  Neurological: He is alert and oriented to person, place, and time.  Skin: Skin is dry.  Psychiatric: He has a normal mood and affect.   Increased motor tone is noted in UE bilateral, worse on the right.Spacisity in UEs . Muscle strength about 2-3/5 in upper extremity, except bilateral hands 1-2/5. LEs 2-3/5 Full range of motion in upper and lower extremities. ROM restricted in UEs bilateral, especially in Abduction 50 degrees on the  right , 40 degrees on the left, passive and external rotation 5-10 degrees passive. of spine is not restricted. Fine motor movements are not possible in both hands.  Patient in wheel chair.        Assessment & Plan:  This is a 56 year old male with  1. C5 spinal cord injury in 2011  2. Paraplegic  3. shoulder capsulitis  4. Quatroplegic  Plan :  Continue with medication. Showed patient and his wife some exercises and techniques to improve the movement of the shoulders, and stretching of achilles tendons. I asked patient's wife to apply a heating pad on the patient's neck which has helped him in the past to relax his neck muscles, also showed her how to massage his neck with a tennis ball. Advised  patient to his restart physical therapy as soon as the healing of his ulcer allows. Might consider the repeat Botox injection in the future. Follow up in one month.

## 2011-07-27 ENCOUNTER — Encounter: Payer: Medicare HMO | Attending: Physical Medicine and Rehabilitation | Admitting: Physical Medicine and Rehabilitation

## 2011-07-27 ENCOUNTER — Encounter: Payer: Self-pay | Admitting: Physical Medicine and Rehabilitation

## 2011-07-27 VITALS — BP 94/60 | HR 82 | Ht 70.0 in | Wt 130.0 lb

## 2011-07-27 DIAGNOSIS — G8929 Other chronic pain: Secondary | ICD-10-CM | POA: Insufficient documentation

## 2011-07-27 DIAGNOSIS — M25519 Pain in unspecified shoulder: Secondary | ICD-10-CM | POA: Insufficient documentation

## 2011-07-27 DIAGNOSIS — G8250 Quadriplegia, unspecified: Secondary | ICD-10-CM | POA: Insufficient documentation

## 2011-07-27 DIAGNOSIS — L8990 Pressure ulcer of unspecified site, unspecified stage: Secondary | ICD-10-CM | POA: Insufficient documentation

## 2011-07-27 DIAGNOSIS — M542 Cervicalgia: Secondary | ICD-10-CM | POA: Insufficient documentation

## 2011-07-27 DIAGNOSIS — M7500 Adhesive capsulitis of unspecified shoulder: Secondary | ICD-10-CM | POA: Insufficient documentation

## 2011-07-27 DIAGNOSIS — G8220 Paraplegia, unspecified: Secondary | ICD-10-CM

## 2011-07-27 DIAGNOSIS — S14105A Unspecified injury at C5 level of cervical spinal cord, initial encounter: Secondary | ICD-10-CM

## 2011-07-27 DIAGNOSIS — IMO0002 Reserved for concepts with insufficient information to code with codable children: Secondary | ICD-10-CM | POA: Insufficient documentation

## 2011-07-27 DIAGNOSIS — X58XXXS Exposure to other specified factors, sequela: Secondary | ICD-10-CM | POA: Insufficient documentation

## 2011-07-27 DIAGNOSIS — L89109 Pressure ulcer of unspecified part of back, unspecified stage: Secondary | ICD-10-CM | POA: Insufficient documentation

## 2011-07-27 MED ORDER — OXYMORPHONE HCL ER 20 MG PO TB12: 20.0000 mg | ORAL_TABLET | Freq: Two times a day (BID) | ORAL | Status: DC | PRN

## 2011-07-27 MED ORDER — OXYCODONE HCL 5 MG PO CAPS: 5.0000 mg | ORAL_CAPSULE | Freq: Four times a day (QID) | ORAL | Status: DC | PRN

## 2011-07-27 NOTE — Patient Instructions (Signed)
Continue with exercising and stretching, start with home PT and OT

## 2011-07-27 NOTE — Progress Notes (Signed)
Subjective:    Patient ID: [name], male    DOB: 1955-08-10, 56 y.o.   MRN: [id number]  HPI The patient complains about chronic pain in his neck and shoulders.  The problem has been stable. He reports that his sacral decubitus ulcer has improved, and that home health is coming to his house to take care of those wounds twice a week. Discussed the importance of passive movement of both shoulders with the patient's wife. I also showed her some exercises and techniques again to improve the range of motion of both shoulders.   Pain Inventory Average Pain 5 Pain Right Now 6 My pain is sharp, stabbing and tingling  In the last 24 hours, has pain interfered with the following? General activity 6 Relation with others 5 Enjoyment of life 6 What TIME of day is your pain at its worst? evening Sleep (in general) Fair  Pain is worse with: some activites Pain improves with: therapy/exercise and medication Relief from Meds: 6  Mobility ability to climb steps?  no do you drive?  no use a wheelchair needs help with transfers  Function disabled: date disabled   Neuro/Psych tremor  Prior Studies Any changes since last visit?  no  Physicians involved in your care Any changes since last visit?  no   History reviewed. No pertinent family history. History   Social History  . Marital Status: Married    Spouse Name: N/A    Number of Children: N/A  . Years of Education: N/A   Social History Main Topics  . Smoking status: Former Smoker    Quit date: 07/23/2009  . Smokeless tobacco: Never Used  . Alcohol Use: No  . Drug Use: No  . Sexually Active: None   Other Topics Concern  . None   Social History Narrative  . None   Past Surgical History  Procedure Date  . Back surgery    Past Medical History  Diagnosis Date  . C1-C4 level spinal cord injury with other specified spinal cord injury, without evidence of spinal bone injury   . Chronic ulcer of skin   . Neurogenic  bowel   . Neurogenic bladder   . Congenital quadriplegia   . Anemia   . Asthma   . Blood transfusion    BP 94/60  Pulse 82  Ht 5\' 10"  (1.778 m)  Wt 130 lb (58.968 kg)  BMI 18.65 kg/m2  SpO2 94%      Review of Systems  Constitutional: Negative.   HENT: Negative.   Eyes: Negative.   Respiratory: Negative.   Cardiovascular: Negative.   Gastrointestinal: Negative.   Genitourinary: Negative.   Musculoskeletal: Negative.   Skin: Negative.   Neurological: Positive for tremors.  Hematological: Negative.   Psychiatric/Behavioral: Negative.        Objective:   Physical Exam  Constitutional: He is oriented to person, place, and time. He appears well-developed.  Patient is in a wheelchair and paraplegic  HENT:  Head: Normocephalic.  Eyes: Pupils are equal, round, and reactive to light.  Neurological: He is alert and oriented to person, place, and time.  Skin: Skin is dry.  Psychiatric: He has a normal mood and affect.  Increased motor tone is noted in UE bilateral, worse on the right.Spacisity in UEs . Muscle strength about 2-3/5 in upper extremity, except bilateral hands 1-2/5. LEs 2-3/5 . Restricted range of motion in upper and lower extremities. ROM restricted in UEs bilateral, especially in Abduction 50 degrees on the right ,  40 degrees on the left, passive and external rotation 5-10 degrees passive. Almost no active motion possible in hands bilateral.   Patient in wheel chair.       Assessment & Plan:  This is a 56 year old male with  1. C5 spinal cord injury in 2011  2. Paraplegic  3. shoulder capsulitis  4. Quatroplegic  Plan :  Continue with medication. Showed patient and his wife some exercises and techniques to improve the movement of the shoulders, and stretching of achilles tendons. I asked patient's wife to apply a heating pad on the patient's neck which has helped him in the past to relax his neck muscles, also showed her how to massage his neck with a  tennis ball.Ordered PT and OT in the home.Might consider to repeat Botox injection in the future, although patient reports that the last injection was not as effective as the one before, which was his first one.  Follow up in one month.

## 2011-08-10 ENCOUNTER — Encounter (HOSPITAL_BASED_OUTPATIENT_CLINIC_OR_DEPARTMENT_OTHER): Payer: Medicare HMO | Attending: General Surgery

## 2011-08-10 DIAGNOSIS — L89109 Pressure ulcer of unspecified part of back, unspecified stage: Secondary | ICD-10-CM | POA: Insufficient documentation

## 2011-08-10 DIAGNOSIS — L89899 Pressure ulcer of other site, unspecified stage: Secondary | ICD-10-CM | POA: Insufficient documentation

## 2011-08-10 DIAGNOSIS — L8990 Pressure ulcer of unspecified site, unspecified stage: Secondary | ICD-10-CM | POA: Insufficient documentation

## 2011-08-10 DIAGNOSIS — L89609 Pressure ulcer of unspecified heel, unspecified stage: Secondary | ICD-10-CM | POA: Insufficient documentation

## 2011-08-10 DIAGNOSIS — L89209 Pressure ulcer of unspecified hip, unspecified stage: Secondary | ICD-10-CM | POA: Insufficient documentation

## 2011-08-13 ENCOUNTER — Encounter (HOSPITAL_BASED_OUTPATIENT_CLINIC_OR_DEPARTMENT_OTHER): Payer: Medicare HMO

## 2011-08-27 ENCOUNTER — Telehealth: Payer: Self-pay | Admitting: Physical Medicine & Rehabilitation

## 2011-08-27 DIAGNOSIS — G8250 Quadriplegia, unspecified: Secondary | ICD-10-CM

## 2011-08-27 DIAGNOSIS — S14105A Unspecified injury at C5 level of cervical spinal cord, initial encounter: Secondary | ICD-10-CM

## 2011-08-27 MED ORDER — OXYCODONE HCL 5 MG PO CAPS: 5.0000 mg | ORAL_CAPSULE | Freq: Four times a day (QID) | ORAL | Status: DC | PRN

## 2011-08-27 MED ORDER — OXYMORPHONE HCL ER 20 MG PO TB12: 20.0000 mg | ORAL_TABLET | Freq: Two times a day (BID) | ORAL | Status: DC | PRN

## 2011-08-27 NOTE — Telephone Encounter (Signed)
[facility], PLEASE CALL.

## 2011-08-27 NOTE — Telephone Encounter (Signed)
Pt would like to use [facility] instead of [facility].  [facility] info [phone]  Fax [phone]  Please send order.

## 2011-08-28 ENCOUNTER — Encounter: Payer: Medicare HMO | Admitting: Physical Medicine and Rehabilitation

## 2011-09-28 ENCOUNTER — Encounter: Payer: Self-pay | Admitting: Physical Medicine & Rehabilitation

## 2011-09-28 ENCOUNTER — Encounter: Payer: Medicare HMO | Attending: Physical Medicine and Rehabilitation | Admitting: Physical Medicine & Rehabilitation

## 2011-09-28 VITALS — BP 87/55 | HR 70 | Resp 16 | Ht 70.0 in | Wt 130.0 lb

## 2011-09-28 DIAGNOSIS — L89159 Pressure ulcer of sacral region, unspecified stage: Secondary | ICD-10-CM

## 2011-09-28 DIAGNOSIS — M25819 Other specified joint disorders, unspecified shoulder: Secondary | ICD-10-CM | POA: Insufficient documentation

## 2011-09-28 DIAGNOSIS — S14105A Unspecified injury at C5 level of cervical spinal cord, initial encounter: Secondary | ICD-10-CM | POA: Insufficient documentation

## 2011-09-28 DIAGNOSIS — G8250 Quadriplegia, unspecified: Secondary | ICD-10-CM | POA: Insufficient documentation

## 2011-09-28 DIAGNOSIS — X58XXXS Exposure to other specified factors, sequela: Secondary | ICD-10-CM | POA: Insufficient documentation

## 2011-09-28 DIAGNOSIS — L8990 Pressure ulcer of unspecified site, unspecified stage: Secondary | ICD-10-CM

## 2011-09-28 DIAGNOSIS — L89109 Pressure ulcer of unspecified part of back, unspecified stage: Secondary | ICD-10-CM

## 2011-09-28 DIAGNOSIS — G8220 Paraplegia, unspecified: Secondary | ICD-10-CM

## 2011-09-28 MED ORDER — OXYMORPHONE HCL ER 20 MG PO TB12: 20.0000 mg | ORAL_TABLET | Freq: Two times a day (BID) | ORAL | Status: DC | PRN

## 2011-09-28 MED ORDER — OXYCODONE HCL 5 MG PO CAPS: 5.0000 mg | ORAL_CAPSULE | Freq: Four times a day (QID) | ORAL | Status: DC | PRN

## 2011-09-28 NOTE — Progress Notes (Signed)
Subjective:    Patient ID: [name], male    DOB: 1955-10-07, 56 y.o.   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 He feels the botox injections worked for a month or two but have since worn off. He was ordered home PT and OT and feels the OT who's still coming out has helped with his ROM. He is still limited with his range quite q bit. He has little assistance with HEP most of the time.   His pain is most severe in his neck/shoulders.   Skin is intact   Pain Inventory Average Pain 4 Pain Right Now 4 My pain is intermittent  In the last 24 hours, has pain interfered with the following? General activity 4 Relation with others 4 Enjoyment of life 4 What TIME of day is your pain at its worst? night Sleep (in general) Good  Pain is worse with: inactivity Pain improves with: therapy/exercise and medication Relief from Meds: 4  Mobility ability to climb steps?  no do you drive?  no use a wheelchair  Function disabled: date disabled 2011 I need assistance with the following:  feeding, dressing, bathing and toileting  Neuro/Psych No problems in this area  Prior Studies Any changes since last visit?  no  Physicians involved in your care Any changes since last visit?  no   History reviewed. No pertinent family history. History   Social History  . Marital Status: Married    Spouse Name: N/A    Number of Children: N/A  . Years of Education: N/A   Social History Main Topics  . Smoking status: Former Smoker    Quit date: 07/23/2009  . Smokeless tobacco: Never Used  . Alcohol Use: No  . Drug Use: No  . Sexually Active: None   Other Topics Concern  . None   Social History Narrative  . None   Past Surgical History  Procedure Date  . Back surgery    Past Medical History  Diagnosis Date  . C1-C4 level spinal cord injury with other specified spinal cord injury, without evidence of spinal bone injury   . Chronic ulcer of skin     . Neurogenic bowel   . Neurogenic bladder   . Congenital quadriplegia   . Anemia   . Asthma   . Blood transfusion    BP 87/55  Pulse 70  Resp 16  Ht 5\' 10"  (1.778 m)  Wt 130 lb (58.968 kg)  BMI 18.65 kg/m2  SpO2 98%      Review of Systems  Constitutional: Negative.   HENT: Negative.   Eyes: Negative.   Respiratory: Negative.   Cardiovascular: Negative.   Gastrointestinal: Negative.   Genitourinary: Negative.   Musculoskeletal: Negative.   Skin: Negative.   Neurological: Negative.   Hematological: Negative.   Psychiatric/Behavioral: Negative.        Objective:   Physical Exam  Constitutional: He is oriented to person, place, and time. He appears well-developed.  Patient is in a wheelchair and paraplegic  HENT:  Head: Normocephalic.  Eyes: Pupils are equal, round, and reactive to light.  Neurological: He is alert and oriented to person, place, and time.  Skin: Skin is dry.  Psychiatric: He has a normal mood and affect.  Increased motor tone is noted in UE bilateral, worse on the right.Spacisity in UEs . Muscle strength about 2-3/5 in upper extremity, except bilateral hands 1-2/5. LEs 2-3/5 . Restricted range of motion in upper and lower  extremities. ROM restricted in UEs bilateral, especially in Abduction 50 degrees on the right , 40 degrees on the left, passive and external rotation 5-10 degrees passive. Almost no active motion possible in hands bilateral.  Patient in wheel chair.  Assessment & Plan:   This is a 56 year old male with  1. C5 spinal cord injury in 2011  2. Spastic tetraplegia 3. shoulder capsulitis bilaterally right more than left 4. Neurogenic bowel and bladder   Plan :  Continue with medication. Showed patient and his wife some exercises and techniques to improve the movement of the shoulders, and stretching of achilles tendons. I asked patient's wife to apply a heating pad on the patient's neck which has helped him in the past to relax his  neck muscles, also showed her how to massage his neck with a tennis ball.Ordered PT and OT in the home.  Will arrange botox injections again to the UE's including the pec major,minor, BR, Wrist flexors. Will potentially try phenol injection to the musculocutaneous nerve  Will inject bilateral shoulders today with 40mg  methylprednisolone and 3cc 1% lidocaine  Follow up in about a month for botox injections  Need to follow up regarding bladder (ie U/S) sometime soon

## 2011-10-29 ENCOUNTER — Encounter: Payer: Medicare HMO | Attending: Physical Medicine and Rehabilitation | Admitting: Physical Medicine & Rehabilitation

## 2011-10-29 ENCOUNTER — Encounter: Payer: Self-pay | Admitting: Physical Medicine & Rehabilitation

## 2011-10-29 VITALS — BP 97/56 | HR 71 | Resp 18

## 2011-10-29 DIAGNOSIS — M25819 Other specified joint disorders, unspecified shoulder: Secondary | ICD-10-CM | POA: Insufficient documentation

## 2011-10-29 DIAGNOSIS — G8250 Quadriplegia, unspecified: Secondary | ICD-10-CM

## 2011-10-29 DIAGNOSIS — X58XXXS Exposure to other specified factors, sequela: Secondary | ICD-10-CM | POA: Insufficient documentation

## 2011-10-29 DIAGNOSIS — S14105A Unspecified injury at C5 level of cervical spinal cord, initial encounter: Secondary | ICD-10-CM | POA: Insufficient documentation

## 2011-10-29 MED ORDER — OXYMORPHONE HCL ER 20 MG PO TB12: 20.0000 mg | ORAL_TABLET | Freq: Two times a day (BID) | ORAL | Status: DC | PRN

## 2011-10-29 MED ORDER — OXYCODONE HCL 5 MG PO CAPS: 5.0000 mg | ORAL_CAPSULE | Freq: Four times a day (QID) | ORAL | Status: DC | PRN

## 2011-10-29 NOTE — Progress Notes (Signed)
Subjective:    Patient ID: [name], male    DOB: Nov 04, 1955, 56 y.o.   MRN: [id number] Pain Inventory Average Pain 4 Pain Right Now 4 My pain is aching  In the last 24 hours, has pain interfered with the following? General activity 4 Relation with others 5 Enjoyment of life 5 What TIME of day is your pain at its worst? evening Sleep (in general) Good  Pain is worse with: some activites Pain improves with: medication Relief from Meds: 6  Mobility ability to climb steps?  no do you drive?  no Do you have any goals in this area?  yes  Function not employed: date last employed 2002 I need assistance with the following:  feeding, dressing, bathing, toileting, meal prep, household duties and shopping  Neuro/Psych bladder control problems bowel control problems No problems in this area weakness numbness tremor tingling trouble walking spasms  Prior Studies Any changes since last visit?  no  Physicians involved in your care Any changes since last visit?  no   No family history on file. History   Social History  . Marital Status: Married    Spouse Name: N/A    Number of Children: N/A  . Years of Education: N/A   Social History Main Topics  . Smoking status: Former Smoker    Quit date: 07/23/2009  . Smokeless tobacco: Never Used  . Alcohol Use: No  . Drug Use: No  . Sexually Active: None   Other Topics Concern  . None   Social History Narrative  . None   Past Surgical History  Procedure Date  . Back surgery    Past Medical History  Diagnosis Date  . C1-C4 level spinal cord injury with other specified spinal cord injury, without evidence of spinal bone injury   . Chronic ulcer of skin   . Neurogenic bowel   . Neurogenic bladder   . Congenital quadriplegia   . Anemia   . Asthma   . Blood transfusion    BP 97/56  Pulse 71  Resp 18  SpO2 98%  HPI    Review of Systems  Constitutional: Negative.   HENT: Negative.   Eyes: Negative.    Respiratory: Negative.   Cardiovascular: Negative.   Gastrointestinal: Negative.   Genitourinary: Negative.   Musculoskeletal: Positive for gait problem.  Skin: Positive for wound (leg lesions treated at wound center).  Neurological: Negative.  Negative for dizziness, seizures and headaches.  Hematological: Negative.   Psychiatric/Behavioral: Negative.        Objective:   Physical Exam  Constitutional: He is oriented to person, place, and time. He appears well-developed.  Patient is in a wheelchair and paraplegic  HENT:  Head: Normocephalic.  Eyes: Pupils are equal, round, and reactive to light.  Neurological: He is alert and oriented to person, place, and time.  Skin: Skin is dry.  Psychiatric: He has a normal mood and affect.  Increased motor tone is noted in UE bilateral, worse on the right.Spacisity in UEs . Muscle strength about 2-3/5 in upper extremity, except bilateral hands 1-2/5. LEs 2-3/5 . Restricted range of motion in upper and lower extremities. ROM restricted in UEs bilateral, especially in Abduction 50 degrees on the right , 40 degrees on the left, passive and external rotation 5-10 degrees passive. Almost no active motion possible in hands bilateral.  Patient in wheel chair.       Assessment & Plan:  Botox Injection for spasticity using needle EMG guidance  Dilution: 100 Units/ml Indication: Severe spasticity which interferes with ADL,mobility and/or  hygiene and is unresponsive to medication management and other conservative care Informed consent was obtained after describing risks and benefits of the procedure with the patient. This includes bleeding, bruising, infection, excessive weakness, or medication side effects. A REMS form is on file and signed. Needle: 50mm 27g injectable monopolar needle electrode Number of units per muscle Pectoralis left 100u Biceps left 25u FCR 0 Brachioradialis: 50u right FDS, FDP,  left 50 u, right 50 u PT: left 50 u, right  75 u  All injections were done after obtaining appropriate EMG activity and after negative drawback for blood. The patient tolerated the procedure well. Post procedure instructions were given. A followup appointment was made. Unfortunately, given the diffuse nature of his spasticity and lack of regular follow up program, his progrnosis is fair at best for long term benefit here. He does get benefit from the improvement in pain control, ease of hygiene, etc.  I refilled his opana and oxycodone rx's today. i'll see him back inabout 3 months.

## 2011-10-29 NOTE — Patient Instructions (Signed)
Continue with stretching at home

## 2011-11-26 ENCOUNTER — Telehealth: Payer: Self-pay

## 2011-11-26 DIAGNOSIS — G8250 Quadriplegia, unspecified: Secondary | ICD-10-CM

## 2011-11-26 DIAGNOSIS — S14105A Unspecified injury at C5 level of cervical spinal cord, initial encounter: Secondary | ICD-10-CM

## 2011-11-26 MED ORDER — OXYMORPHONE HCL ER 20 MG PO TB12: 20.0000 mg | ORAL_TABLET | Freq: Two times a day (BID) | ORAL | Status: DC | PRN

## 2011-11-26 MED ORDER — OXYCODONE HCL 5 MG PO CAPS: 5.0000 mg | ORAL_CAPSULE | Freq: Four times a day (QID) | ORAL | Status: DC | PRN

## 2011-11-26 NOTE — Telephone Encounter (Signed)
Needs refill on opana and oxycodone.

## 2011-11-26 NOTE — Telephone Encounter (Signed)
RX printed for [name] to sign. [name] notified they will be available for pick up this afternoon before 4:30

## 2011-11-30 ENCOUNTER — Encounter (HOSPITAL_BASED_OUTPATIENT_CLINIC_OR_DEPARTMENT_OTHER): Payer: Medicare HMO

## 2011-12-07 ENCOUNTER — Encounter (HOSPITAL_BASED_OUTPATIENT_CLINIC_OR_DEPARTMENT_OTHER): Payer: Medicare HMO | Attending: General Surgery

## 2011-12-07 DIAGNOSIS — L8992 Pressure ulcer of unspecified site, stage 2: Secondary | ICD-10-CM | POA: Insufficient documentation

## 2011-12-07 DIAGNOSIS — L89609 Pressure ulcer of unspecified heel, unspecified stage: Secondary | ICD-10-CM | POA: Insufficient documentation

## 2011-12-07 DIAGNOSIS — L89899 Pressure ulcer of other site, unspecified stage: Secondary | ICD-10-CM | POA: Insufficient documentation

## 2011-12-07 DIAGNOSIS — L89109 Pressure ulcer of unspecified part of back, unspecified stage: Secondary | ICD-10-CM | POA: Insufficient documentation

## 2011-12-21 ENCOUNTER — Telehealth: Payer: Self-pay | Admitting: *Deleted

## 2011-12-21 DIAGNOSIS — G8250 Quadriplegia, unspecified: Secondary | ICD-10-CM

## 2011-12-21 DIAGNOSIS — S14105A Unspecified injury at C5 level of cervical spinal cord, initial encounter: Secondary | ICD-10-CM

## 2011-12-21 MED ORDER — OXYCODONE HCL 5 MG PO CAPS: 5.0000 mg | ORAL_CAPSULE | Freq: Four times a day (QID) | ORAL | Status: DC | PRN

## 2011-12-21 MED ORDER — OXYMORPHONE HCL ER 20 MG PO TB12: 20.0000 mg | ORAL_TABLET | Freq: Two times a day (BID) | ORAL | Status: DC | PRN

## 2011-12-21 NOTE — Telephone Encounter (Signed)
Informed pateints wife Harriett Sine scripts are ready for pick up.

## 2011-12-21 NOTE — Telephone Encounter (Signed)
Patient needs refill on Oxycodone and Opana. Prescriptions printed for Dr. Riley Kill to sign

## 2012-01-04 ENCOUNTER — Encounter (HOSPITAL_BASED_OUTPATIENT_CLINIC_OR_DEPARTMENT_OTHER): Payer: Medicare HMO

## 2012-01-25 ENCOUNTER — Telehealth: Payer: Self-pay | Admitting: *Deleted

## 2012-01-25 NOTE — Telephone Encounter (Signed)
Call made to patient to verify appointment for Monday. Also notified wife that patient will need to give a urine specimen for UDS. Has not had one since 05/2011. Wife aware urine specimen will need to be provided.

## 2012-01-28 ENCOUNTER — Encounter: Payer: Self-pay | Admitting: Physical Medicine & Rehabilitation

## 2012-01-28 ENCOUNTER — Encounter: Payer: Medicare Other | Attending: Physical Medicine and Rehabilitation | Admitting: Physical Medicine & Rehabilitation

## 2012-01-28 VITALS — BP 79/50 | HR 70 | Resp 14 | Ht 70.0 in | Wt 126.0 lb

## 2012-01-28 DIAGNOSIS — Z5181 Encounter for therapeutic drug level monitoring: Secondary | ICD-10-CM | POA: Insufficient documentation

## 2012-01-28 DIAGNOSIS — G8250 Quadriplegia, unspecified: Secondary | ICD-10-CM | POA: Insufficient documentation

## 2012-01-28 DIAGNOSIS — X58XXXS Exposure to other specified factors, sequela: Secondary | ICD-10-CM | POA: Insufficient documentation

## 2012-01-28 DIAGNOSIS — S14105A Unspecified injury at C5 level of cervical spinal cord, initial encounter: Secondary | ICD-10-CM | POA: Insufficient documentation

## 2012-01-28 DIAGNOSIS — M25819 Other specified joint disorders, unspecified shoulder: Secondary | ICD-10-CM | POA: Insufficient documentation

## 2012-01-28 MED ORDER — OXYCODONE HCL 5 MG PO CAPS: 5.0000 mg | ORAL_CAPSULE | Freq: Four times a day (QID) | ORAL | Status: DC | PRN

## 2012-01-28 MED ORDER — OXYMORPHONE HCL ER 20 MG PO TB12: 20.0000 mg | ORAL_TABLET | Freq: Two times a day (BID) | ORAL | Status: DC | PRN

## 2012-01-28 NOTE — Patient Instructions (Signed)
YOU MUST BRING ALL MEDS WHICH I PRESCRIBE TO YOU TO EACH OF YOUR APPOINTMENTS HERE

## 2012-01-28 NOTE — Progress Notes (Signed)
Subjective:    Patient ID: [name], male    DOB: 05/31/1955, 57 y.o.   MRN: [id number]  HPI  [name] is back regarding his SCI and pain. He states that the botox was helpful for his spasticity however he didn't have therapy come out for over a month after the injection. They are currently working with him on ROM. They will be out at his home several more weeks. He does work on stretching at home to an extent on his own, but he's not consistent.    His pain comes and goes. He says he hurts more when he tries to stretch or move more.   He is emptying his bladder and bowels. He had a recent work up with his family doctor and all of his labwork was good.   He once again did not bring in his pain medications to the office.  Pain Inventory Average Pain 3 Pain Right Now 3 My pain is constant  In the last 24 hours, has pain interfered with the following? General activity 4 Relation with others 3 Enjoyment of life 4 What TIME of day is your pain at its worst? night Sleep (in general) Good  Pain is worse with: n/a Pain improves with: medication Relief from Meds: 9  Mobility ability to climb steps?  no do you drive?  no transfers alone Do you have any goals in this area?  yes  Function disabled: date disabled 2011  Neuro/Psych No problems in this area  Prior Studies Any changes since last visit?  no  Physicians involved in your care Any changes since last visit?  no   History reviewed. No pertinent family history. History   Social History  . Marital Status: Married    Spouse Name: N/A    Number of Children: N/A  . Years of Education: N/A   Social History Main Topics  . Smoking status: Former Smoker    Quit date: 07/23/2009  . Smokeless tobacco: Never Used  . Alcohol Use: No  . Drug Use: No  . Sexually Active: None   Other Topics Concern  . None   Social History Narrative  . None   Past Surgical History  Procedure Date  . Back surgery    Past  Medical History  Diagnosis Date  . C1-C4 level spinal cord injury with other specified spinal cord injury, without evidence of spinal bone injury   . Chronic ulcer of skin   . Neurogenic bowel   . Neurogenic bladder   . Congenital quadriplegia   . Anemia   . Asthma   . Blood transfusion    BP 79/50  Pulse 70  Resp 14  Ht 5\' 10"  (1.778 m)  Wt 126 lb (57.153 kg)  BMI 18.08 kg/m2  SpO2 98%     Review of Systems  All other systems reviewed and are negative.       Objective:   Physical Exam  Constitutional: He is oriented to person, place, and time. He appears well-developed.  Patient is in a wheelchair and paraplegic  HENT:  Head: Normocephalic.  Eyes: Pupils are equal, round, and reactive to light.  Neurological: He is alert and oriented to person, place, and time.  Skin: Skin is dry.  Psychiatric: He has a normal mood and affect.  Increased motor tone is noted in UE bilateral, worse on the right.Spacisity in UEs . Muscle strength about 2-3/5 in upper extremity, except bilateral hands 1-2/5. LEs 2-3/5 . Restricted range of motion  in upper and lower extremities. ROM restricted in UEs bilateral, especially in Abduction 50 degrees on the right , 40 degrees on the left, passive and external rotation 5-10 degrees passive. Almost no active motion possible in hands bilateral. Ashworth scores are 4 at biceps, wrists and fingers, right greater than left Patient in wheel chair.  Assessment & Plan:   This is a 57 year old male with  1. C5 spinal cord injury in 2011  2. Spastic tetraplegia  3. shoulder capsulitis bilaterally right more than left  4. Neurogenic bowel and bladder   Plan :  Refilled narcotics today.   [facility] working with him.  Will arrange another round of  botox injections again to the UE's including the biceps, BR, Wrist flexors. Consider phenol injection to the musculocutaneous nerve. Perform over the next 1-2 weeks.  Discussed appropriate  stretching.  Reviewed the fact that UDS monitoring is required to continue narcotics here. He also needs to bring ALL meds with him.  15 minutes of face to face patient care time were spent during this visit. All questions were encouraged and answered.

## 2012-01-28 NOTE — Addendum Note (Signed)
Addended by: Doreene Eland on: 01/28/2012 02:36 PM   Modules accepted: Orders

## 2012-02-25 ENCOUNTER — Other Ambulatory Visit: Payer: Self-pay

## 2012-02-25 DIAGNOSIS — G8250 Quadriplegia, unspecified: Secondary | ICD-10-CM

## 2012-02-25 MED ORDER — OXYCODONE HCL 5 MG PO CAPS: 5.0000 mg | ORAL_CAPSULE | Freq: Four times a day (QID) | ORAL | Status: DC | PRN

## 2012-02-25 MED ORDER — OXYMORPHONE HCL ER 20 MG PO TB12: 20.0000 mg | ORAL_TABLET | Freq: Two times a day (BID) | ORAL | Status: DC | PRN

## 2012-02-25 NOTE — Telephone Encounter (Signed)
Patient needs opana and oxy refilled.

## 2012-02-25 NOTE — Telephone Encounter (Signed)
Refill printed for [name] to pick up for patient.

## 2012-03-24 ENCOUNTER — Encounter (HOSPITAL_BASED_OUTPATIENT_CLINIC_OR_DEPARTMENT_OTHER): Payer: Medicare Other

## 2012-03-25 ENCOUNTER — Other Ambulatory Visit: Payer: Self-pay

## 2012-03-25 DIAGNOSIS — G8250 Quadriplegia, unspecified: Secondary | ICD-10-CM

## 2012-03-25 MED ORDER — OXYCODONE HCL 5 MG PO CAPS: 5.0000 mg | ORAL_CAPSULE | Freq: Four times a day (QID) | ORAL | Status: DC | PRN

## 2012-03-25 MED ORDER — OXYMORPHONE HCL ER 20 MG PO TB12: 20.0000 mg | ORAL_TABLET | Freq: Two times a day (BID) | ORAL | Status: DC | PRN

## 2012-04-25 ENCOUNTER — Encounter: Payer: Medicare Other | Attending: Physical Medicine & Rehabilitation | Admitting: Physical Medicine & Rehabilitation

## 2012-04-25 ENCOUNTER — Encounter: Payer: Self-pay | Admitting: Physical Medicine & Rehabilitation

## 2012-04-25 VITALS — BP 120/60 | HR 70 | Resp 16 | Ht 70.0 in | Wt 135.0 lb

## 2012-04-25 DIAGNOSIS — S14105D Unspecified injury at C5 level of cervical spinal cord, subsequent encounter: Secondary | ICD-10-CM

## 2012-04-25 DIAGNOSIS — Z5189 Encounter for other specified aftercare: Secondary | ICD-10-CM

## 2012-04-25 DIAGNOSIS — G8250 Quadriplegia, unspecified: Secondary | ICD-10-CM | POA: Insufficient documentation

## 2012-04-25 DIAGNOSIS — G8110 Spastic hemiplegia affecting unspecified side: Secondary | ICD-10-CM

## 2012-04-25 MED ORDER — OXYMORPHONE HCL ER 20 MG PO TB12: 20.0000 mg | ORAL_TABLET | Freq: Two times a day (BID) | ORAL | Status: DC | PRN

## 2012-04-25 MED ORDER — OXYCODONE HCL 5 MG PO CAPS: 5.0000 mg | ORAL_CAPSULE | Freq: Four times a day (QID) | ORAL | Status: DC | PRN

## 2012-04-25 NOTE — Patient Instructions (Signed)
CALL ME WITH ANY PROBLEMS OR QUESTIONS (#[phone]).  HAVE A GOOD DAY  

## 2012-04-25 NOTE — Progress Notes (Signed)
Botox Injection for spasticity using needle EMG guidance  Dilution: 100 Units/ml Indication: Severe spasticity which interferes with ADL,mobility and/or  hygiene and is unresponsive to medication management and other conservative care Informed consent was obtained after describing risks and benefits of the procedure with the patient. This includes bleeding, bruising, infection, excessive weakness, or medication side effects. A REMS form is on file and signed. Needle: 50mm injectable monopolar needle electrode Number of units per muscle Pectoralis 0 Left lat 50 Left Teres major and minor 150 FCR 25u FCU25u FDS75u FDP75u  All injections were done after obtaining appropriate EMG activity and after negative drawback for blood. The patient tolerated the procedure well. Post procedure instructions were given. A followup appointment was made.   Narcotics were refilled today and a referral was made to home health OT for spasticity mgt of the LUE.

## 2012-05-28 ENCOUNTER — Encounter: Payer: Self-pay | Admitting: Physical Medicine and Rehabilitation

## 2012-05-28 ENCOUNTER — Encounter
Payer: Medicare Other | Attending: Physical Medicine and Rehabilitation | Admitting: Physical Medicine and Rehabilitation

## 2012-05-28 VITALS — BP 91/46 | HR 73 | Resp 14 | Ht 70.0 in | Wt 135.0 lb

## 2012-05-28 DIAGNOSIS — M542 Cervicalgia: Secondary | ICD-10-CM | POA: Insufficient documentation

## 2012-05-28 DIAGNOSIS — G8929 Other chronic pain: Secondary | ICD-10-CM | POA: Insufficient documentation

## 2012-05-28 DIAGNOSIS — S14105S Unspecified injury at C5 level of cervical spinal cord, sequela: Secondary | ICD-10-CM

## 2012-05-28 DIAGNOSIS — G8250 Quadriplegia, unspecified: Secondary | ICD-10-CM | POA: Insufficient documentation

## 2012-05-28 DIAGNOSIS — IMO0002 Reserved for concepts with insufficient information to code with codable children: Secondary | ICD-10-CM | POA: Insufficient documentation

## 2012-05-28 DIAGNOSIS — M7500 Adhesive capsulitis of unspecified shoulder: Secondary | ICD-10-CM | POA: Insufficient documentation

## 2012-05-28 DIAGNOSIS — M25519 Pain in unspecified shoulder: Secondary | ICD-10-CM | POA: Insufficient documentation

## 2012-05-28 DIAGNOSIS — X58XXXS Exposure to other specified factors, sequela: Secondary | ICD-10-CM | POA: Insufficient documentation

## 2012-05-28 DIAGNOSIS — K592 Neurogenic bowel, not elsewhere classified: Secondary | ICD-10-CM | POA: Insufficient documentation

## 2012-05-28 DIAGNOSIS — N319 Neuromuscular dysfunction of bladder, unspecified: Secondary | ICD-10-CM | POA: Insufficient documentation

## 2012-05-28 MED ORDER — OXYCODONE HCL 5 MG PO CAPS: 5.0000 mg | ORAL_CAPSULE | Freq: Four times a day (QID) | ORAL | Status: DC | PRN

## 2012-05-28 MED ORDER — OXYMORPHONE HCL ER 20 MG PO TB12: 20.0000 mg | ORAL_TABLET | Freq: Two times a day (BID) | ORAL | Status: DC | PRN

## 2012-05-28 NOTE — Patient Instructions (Addendum)
Continue with stretching exercises. Continue with HH therapy.

## 2012-05-28 NOTE — Progress Notes (Signed)
Subjective:    Patient ID: [name], male    DOB: August 09, 1955, 57 y.o.   MRN: [id number]  HPI The patient complains about chronic pain in his neck and shoulders.  The problem has been stable. He reports that the last botox injection has helped to get a better ROM in his LUE.  Discussed the importance of passive movement of both shoulders with the patient's wife. I also showed her some exercises and techniques again to improve the range of motion of both shoulders.Also recommended stretches for his achilles tendon.    Pain Inventory Average Pain 5 Pain Right Now 5 My pain is dull and aching  In the last 24 hours, has pain interfered with the following? General activity 4 Relation with others 5 Enjoyment of life 5 What TIME of day is your pain at its worst? night Sleep (in general) Good  Pain is worse with: some activites Pain improves with: rest, heat/ice and medication Relief from Meds: 9  Mobility ability to climb steps?  no do you drive?  no  Function disabled: date disabled 10 I need assistance with the following:  feeding, dressing, bathing and toileting Do you have any goals in this area?  yes  Neuro/Psych No problems in this area  Prior Studies Any changes since last visit?  no  Physicians involved in your care Any changes since last visit?  no   History reviewed. No pertinent family history. History   Social History  . Marital Status: Married    Spouse Name: N/A    Number of Children: N/A  . Years of Education: N/A   Social History Main Topics  . Smoking status: Former Smoker    Quit date: 07/23/2009  . Smokeless tobacco: Never Used  . Alcohol Use: No  . Drug Use: No  . Sexually Active: None   Other Topics Concern  . None   Social History Narrative  . None   Past Surgical History  Procedure Laterality Date  . Back surgery     Past Medical History  Diagnosis Date  . C1-C4 level spinal cord injury with other specified spinal cord  injury, without evidence of spinal bone injury   . Chronic ulcer of skin   . Neurogenic bowel   . Neurogenic bladder   . Congenital quadriplegia   . Anemia   . Asthma   . Blood transfusion    BP 91/46  Pulse 73  Resp 14  Ht 5\' 10"  (1.778 m)  Wt 135 lb (61.236 kg)  BMI 19.37 kg/m2  SpO2 96%     Review of Systems  Gastrointestinal: Positive for constipation.  All other systems reviewed and are negative.       Objective:   Physical Exam Constitutional: He is oriented to person, place, and time. He appears well-developed.  Patient is in a wheelchair and tetraplegic  HENT:  Head: Normocephalic.  Eyes: Pupils are equal, round, and reactive to light.  Neurological: He is alert and oriented to person, place, and time.  Skin: Skin is dry.  Psychiatric: He has a normal mood and affect.  Increased motor tone is noted in UE bilateral, worse on the right.Spacisity in UEs . Muscle strength about 2-3/5 in upper extremity, except bilateral hands 1-2/5. LEs 2-3/5 . Restricted range of motion in upper and lower extremities. ROM restricted in UEs bilateral, especially in Abduction 50 degrees on the right , 40 degrees on the left, passive and external rotation 5-10 degrees passive. Almost no active  motion possible in hands bilateral.  Patient in wheel chair.        Assessment & Plan:  This is a 57 year old male with  1. C5 spinal cord injury in 2011  2. Spastic tetraplegia  3. shoulder capsulitis bilaterally right more than left  4. Neurogenic bowel and bladder  Plan :  Refilled narcotics today.  [facility] working with him, should continue.

## 2012-06-10 ENCOUNTER — Telehealth: Payer: Self-pay

## 2012-06-10 NOTE — Telephone Encounter (Signed)
Patient wife called requesting a foot board for his bed.  He is turning his feet out and she was told this would help.  Please order.

## 2012-06-12 NOTE — Telephone Encounter (Signed)
We can not prescribe something like that, also talked to Dr. Riley Kill said the same

## 2012-06-13 NOTE — Telephone Encounter (Signed)
Notified Mrs Avants.

## 2012-06-27 ENCOUNTER — Telehealth: Payer: Self-pay

## 2012-06-27 DIAGNOSIS — G8250 Quadriplegia, unspecified: Secondary | ICD-10-CM

## 2012-06-27 MED ORDER — OXYMORPHONE HCL ER 20 MG PO TB12: 20.0000 mg | ORAL_TABLET | Freq: Two times a day (BID) | ORAL | Status: DC | PRN

## 2012-06-27 MED ORDER — OXYCODONE HCL 5 MG PO CAPS: 5.0000 mg | ORAL_CAPSULE | Freq: Four times a day (QID) | ORAL | Status: DC | PRN

## 2012-06-27 NOTE — Telephone Encounter (Signed)
Tried to contact patient to let him know his script is ready but phone just kept ringing.

## 2012-06-27 NOTE — Telephone Encounter (Signed)
Family picked up script.

## 2012-06-27 NOTE — Telephone Encounter (Signed)
Refill request for opana and oxy.  These were printed.  Will call family when signed.

## 2012-07-24 ENCOUNTER — Telehealth: Payer: Self-pay

## 2012-07-24 DIAGNOSIS — G8250 Quadriplegia, unspecified: Secondary | ICD-10-CM

## 2012-07-24 NOTE — Telephone Encounter (Signed)
Oxycodone and opana refill request.

## 2012-07-25 MED ORDER — OXYMORPHONE HCL ER 20 MG PO TB12: 20.0000 mg | ORAL_TABLET | Freq: Two times a day (BID) | ORAL | Status: DC | PRN

## 2012-07-25 MED ORDER — OXYCODONE HCL 5 MG PO CAPS: 5.0000 mg | ORAL_CAPSULE | Freq: Four times a day (QID) | ORAL | Status: DC | PRN

## 2012-07-25 NOTE — Telephone Encounter (Signed)
Scripts printed and picked up.

## 2012-08-20 ENCOUNTER — Encounter (HOSPITAL_BASED_OUTPATIENT_CLINIC_OR_DEPARTMENT_OTHER): Payer: Medicare Other | Attending: General Surgery

## 2012-08-20 DIAGNOSIS — L8993 Pressure ulcer of unspecified site, stage 3: Secondary | ICD-10-CM | POA: Insufficient documentation

## 2012-08-20 DIAGNOSIS — L89609 Pressure ulcer of unspecified heel, unspecified stage: Secondary | ICD-10-CM | POA: Insufficient documentation

## 2012-08-20 DIAGNOSIS — R209 Unspecified disturbances of skin sensation: Secondary | ICD-10-CM | POA: Insufficient documentation

## 2012-08-21 NOTE — Progress Notes (Signed)
[facility]  NAME:  [name], [name] NO.:  [id number]  MEDICAL RECORD NO.:  [id number]      DATE OF BIRTH:  November 06, 1955  PHYSICIAN:  Ardath Sax, M.D.           VISIT DATE:                                  OFFICE VISIT   This is a 57 year old gentleman with high paresthesias from a spinal injury after a fall about 3 years ago.  He has been a patient in the [facility] many times.  He has had mostly problems with pressure ulcers.  He has had them on his sacrum, his back, his heels, and his feet and his legs.  Today, he enters with a stage II ulcer involving his Heels.  He came here today with a blood pressure of 140/80, he has a temperature of 98, pulse 86, respirations 16.  He has good pulses.  He has clear lung fields.  He has been eating well and gaining weight.  He is taken care of by his wife, but he is basically bed ridden.  She does get him up in a chair now and then, but not very often.  He has many medicines that he takes, gabapentin, tramadol, Phenergan, pantoprazole, temazepam, megestrol, oxycodone, and a fentanyl patch.  He has a history of alcoholism in the past.  So with his severe paresthesias and from his spinal cord injury, he has problems with pressure ulcers.  I would say that 2 he has today on his left heel is a stage III.  It is about 2 cm in diameter.  The one on the dorsal aspect of his right foot is 2 x 1, and I would call this a stage III ulcer.  The old healed scars on the back of his right leg and his sacrum are okay.  He is alert and says that he is trying to eat, he has been gaining weight.  So, we will see him back here in a week.  Today, we scrubbed him up.  I debrided the necrotic material.  I had to use some silver nitrate on the wounds and then we treated them with bulky dressings and silver alginate on the wounds.     Ardath Sax, M.D.     PP/MEDQ  D:  08/20/2012  T:  08/21/2012  Job:  [id number]

## 2012-08-22 ENCOUNTER — Encounter: Payer: Medicare Other | Attending: Physical Medicine and Rehabilitation | Admitting: Physical Medicine & Rehabilitation

## 2012-08-22 ENCOUNTER — Encounter: Payer: Self-pay | Admitting: Physical Medicine & Rehabilitation

## 2012-08-22 VITALS — BP 97/62 | HR 76 | Resp 16 | Ht 70.0 in | Wt 153.0 lb

## 2012-08-22 DIAGNOSIS — Z5181 Encounter for therapeutic drug level monitoring: Secondary | ICD-10-CM

## 2012-08-22 DIAGNOSIS — IMO0002 Reserved for concepts with insufficient information to code with codable children: Secondary | ICD-10-CM

## 2012-08-22 DIAGNOSIS — X58XXXS Exposure to other specified factors, sequela: Secondary | ICD-10-CM | POA: Insufficient documentation

## 2012-08-22 DIAGNOSIS — S14105S Unspecified injury at C5 level of cervical spinal cord, sequela: Secondary | ICD-10-CM

## 2012-08-22 DIAGNOSIS — M25819 Other specified joint disorders, unspecified shoulder: Secondary | ICD-10-CM

## 2012-08-22 DIAGNOSIS — Z79899 Other long term (current) drug therapy: Secondary | ICD-10-CM

## 2012-08-22 DIAGNOSIS — G8250 Quadriplegia, unspecified: Secondary | ICD-10-CM

## 2012-08-22 MED ORDER — TIZANIDINE HCL 2 MG PO TABS: 2.0000 mg | ORAL_TABLET | Freq: Three times a day (TID) | ORAL | Status: DC

## 2012-08-22 MED ORDER — OXYCODONE HCL 5 MG PO CAPS: 5.0000 mg | ORAL_CAPSULE | Freq: Four times a day (QID) | ORAL | Status: DC | PRN

## 2012-08-22 MED ORDER — OXYMORPHONE HCL ER 20 MG PO TB12: 20.0000 mg | ORAL_TABLET | Freq: Two times a day (BID) | ORAL | Status: DC | PRN

## 2012-08-22 NOTE — Progress Notes (Signed)
Subjective:    Patient ID: [name], male    DOB: 1955/03/05, 57 y.o.   MRN: [id number]  HPI  [name] is back regarding his spastic tetraplegia. He has been doing better with his pain, but his spasticity has remained problematic. When he stretches his pain is worse. The botox injections we performed in April proved helpful but last only 2 months or so.   He is left handed and would like to be able to move his hand better for every dayactivities.   His wife reports that his toes are "curling" up at times. He is wearing his PRAFO''s and keeps his wounds dressed as needed.   Pain Inventory Average Pain 4 Pain Right Now 4 My pain is sharp  In the last 24 hours, has pain interfered with the following? General activity 4 Relation with others 4 Enjoyment of life 4 What TIME of day is your pain at its worst? daytime Sleep (in general) Fair  Pain is worse with: some activites Pain improves with: medication Relief from Meds: 9  Mobility ability to climb steps?  no do you drive?  no use a wheelchair needs help with transfers Do you have any goals in this area?  yes  Function disabled: date disabled . I need assistance with the following:  feeding, dressing, bathing and toileting  Neuro/Psych trouble walking  Prior Studies Any changes since last visit?  no  Physicians involved in your care Any changes since last visit?  no   History reviewed. No pertinent family history. History   Social History  . Marital Status: Married    Spouse Name: N/A    Number of Children: N/A  . Years of Education: N/A   Social History Main Topics  . Smoking status: Former Smoker    Quit date: 07/23/2009  . Smokeless tobacco: Never Used  . Alcohol Use: No  . Drug Use: No  . Sexual Activity: None   Other Topics Concern  . None   Social History Narrative  . None   Past Surgical History  Procedure Laterality Date  . Back surgery     Past Medical History  Diagnosis Date  .  C1-C4 level spinal cord injury with other specified spinal cord injury, without evidence of spinal bone injury   . Chronic ulcer of skin   . Neurogenic bowel   . Neurogenic bladder   . Congenital quadriplegia   . Anemia   . Asthma   . Blood transfusion    BP 97/62  Pulse 76  Resp 16  Ht 5\' 10"  (1.778 m)  Wt 153 lb (69.4 kg)  BMI 21.95 kg/m2  SpO2 97%     Review of Systems  HENT: Positive for neck pain.   Gastrointestinal: Positive for constipation.  Musculoskeletal: Positive for gait problem.  All other systems reviewed and are negative.       Objective:   Physical Exam  Constitutional: He is oriented to person, place, and time. He appears well-developed.  Patient is in a wheelchair and paraplegic  HENT:  Head: Normocephalic.  Eyes: Pupils are equal, round, and reactive to light.  Neurological: He is alert and oriented to person, place, and time.  Skin: Skin is dry. He has areas of breakdown on the feet/ his 4th toe is bleeding. Skin is dry. Psychiatric: He has a normal mood and affect.  Increased motor tone is noted in UE bilateral, worse on the right.Spacisity in UEs . Muscle strength about 2-3/5 in upper  extremity, except bilateral hands 1-2/5 left stronger than right. LEs 2-3/5 . Restricted range of motion in upper and lower extremities. ROM restricted in UEs bilateral, especially in Abduction 40-50 degrees on the right , 40 degrees on the left, passive and external rotation 5-10 degrees passive. Almost no active motion possible in hands bilateral although there is finger extension underneath the flexor tone.. Ashworth scores are 4 at biceps, wrists and fingers, right greater than left. Both feet are flat with no spasticity of the intrinsics noted. His heel cords are tight.  Patient in wheel chair.  Assessment & Plan:   This is a 57 year old male with  1. C5 spinal cord injury in 2011  2. Spastic tetraplegia  3. shoulder capsulitis bilaterally right more than left   4. Neurogenic bowel and bladder   Plan :  Refilled narcotics today.  Will begin a trial of zanaflex  Will set him up with botox to focus on the left wrist and finger flexors, maybe biceps. Consider hand surgery consult.  Discussed appropriate stretching.   15 minutes of face to face patient care time were spent during this visit. All questions were encouraged and answered.

## 2012-08-22 NOTE — Patient Instructions (Signed)
CALL ME WITH ANY PROBLEMS OR QUESTIONS (#[phone]).  HAVE A GOOD DAY  

## 2012-08-28 ENCOUNTER — Ambulatory Visit: Payer: Medicare Other | Admitting: Physical Medicine and Rehabilitation

## 2012-09-17 ENCOUNTER — Encounter (HOSPITAL_BASED_OUTPATIENT_CLINIC_OR_DEPARTMENT_OTHER): Payer: Medicare Other | Attending: General Surgery

## 2012-09-26 ENCOUNTER — Encounter: Payer: Medicare Other | Attending: Physical Medicine and Rehabilitation | Admitting: Physical Medicine & Rehabilitation

## 2012-09-26 ENCOUNTER — Encounter: Payer: Self-pay | Admitting: Physical Medicine & Rehabilitation

## 2012-09-26 VITALS — BP 112/58 | HR 88 | Resp 14

## 2012-09-26 DIAGNOSIS — X58XXXS Exposure to other specified factors, sequela: Secondary | ICD-10-CM | POA: Insufficient documentation

## 2012-09-26 DIAGNOSIS — G8250 Quadriplegia, unspecified: Secondary | ICD-10-CM | POA: Insufficient documentation

## 2012-09-26 MED ORDER — OXYMORPHONE HCL ER 20 MG PO TB12: 20.0000 mg | ORAL_TABLET | Freq: Two times a day (BID) | ORAL | Status: DC | PRN

## 2012-09-26 MED ORDER — OXYCODONE HCL 5 MG PO CAPS: 5.0000 mg | ORAL_CAPSULE | Freq: Four times a day (QID) | ORAL | Status: DC | PRN

## 2012-09-26 NOTE — Patient Instructions (Signed)
CALL ME WITH ANY PROBLEMS OR QUESTIONS (#[phone]).  HAVE A GOOD DAY  

## 2012-09-26 NOTE — Progress Notes (Signed)
Botox Injection for spasticity using needle EMG guidance  Dilution: 100 Units/ml Indication: Severe spasticity which interferes with ADL,mobility and/or  hygiene and is unresponsive to medication management and other conservative care Informed consent was obtained after describing risks and benefits of the procedure with the patient. This includes bleeding, bruising, infection, excessive weakness, or medication side effects. A REMS form is on file and signed. Needle: 50mm injectable monopolar needle electrode Number of units per muscle   FCR 50 u FCU 50u FDS100u FDP 100u PT/PQ 50u x 2    All injections were done after obtaining appropriate EMG activity and after negative drawback for blood. The patient tolerated the procedure well. Post procedure instructions were given. A followup appointment was made.   Given the patient's lack of progress with these and inconsistent stretching program, this will likely be the last set of injections we do.

## 2012-10-27 ENCOUNTER — Encounter: Payer: Self-pay | Admitting: Physical Medicine and Rehabilitation

## 2012-10-27 ENCOUNTER — Encounter
Payer: Medicare Other | Attending: Physical Medicine and Rehabilitation | Admitting: Physical Medicine and Rehabilitation

## 2012-10-27 VITALS — BP 99/58 | HR 84 | Resp 16 | Ht 70.0 in | Wt 153.0 lb

## 2012-10-27 DIAGNOSIS — S14105S Unspecified injury at C5 level of cervical spinal cord, sequela: Secondary | ICD-10-CM

## 2012-10-27 DIAGNOSIS — N319 Neuromuscular dysfunction of bladder, unspecified: Secondary | ICD-10-CM | POA: Insufficient documentation

## 2012-10-27 DIAGNOSIS — M7500 Adhesive capsulitis of unspecified shoulder: Secondary | ICD-10-CM | POA: Insufficient documentation

## 2012-10-27 DIAGNOSIS — K592 Neurogenic bowel, not elsewhere classified: Secondary | ICD-10-CM | POA: Insufficient documentation

## 2012-10-27 DIAGNOSIS — G8250 Quadriplegia, unspecified: Secondary | ICD-10-CM | POA: Insufficient documentation

## 2012-10-27 DIAGNOSIS — IMO0002 Reserved for concepts with insufficient information to code with codable children: Secondary | ICD-10-CM

## 2012-10-27 MED ORDER — OXYMORPHONE HCL ER 20 MG PO TB12: 20.0000 mg | ORAL_TABLET | Freq: Two times a day (BID) | ORAL | Status: DC | PRN

## 2012-10-27 MED ORDER — OXYCODONE HCL 5 MG PO CAPS: 5.0000 mg | ORAL_CAPSULE | Freq: Four times a day (QID) | ORAL | Status: DC | PRN

## 2012-10-27 NOTE — Patient Instructions (Signed)
Continue with your stretches

## 2012-10-27 NOTE — Progress Notes (Signed)
Subjective:    Patient ID: [name], male    DOB: [birth year], 57 y.o.   MRN: [id number]  HPI The patient complains about chronic pain in his neck and shoulders.  The problem has been stable. He reports that the last botox injection has helped to get a better ROM in his LUE. Discussed the importance of passive movement of both shoulders with the patient's wife. I also showed her some exercises and techniques again to improve the range of motion of both shoulders.Also recommended stretches for his achilles tendon.  Patient reports that he has some improvement of his motion of his right hand after the last botox injection.  Pain Inventory Average Pain 5 Pain Right Now 6 My pain is sharp  In the last 24 hours, has pain interfered with the following? General activity 5 Relation with others 5 Enjoyment of life 5 What TIME of day is your pain at its worst? evening Sleep (in general) Fair  Pain is worse with: standing Pain improves with: medication Relief from Meds: 9  Mobility use a walker ability to climb steps?  no do you drive?  no use a wheelchair Do you have any goals in this area?  yes  Function disabled: date disabled .  Neuro/Psych trouble walking  Prior Studies Any changes since last visit?  no  Physicians involved in your care Any changes since last visit?  no   History reviewed. No pertinent family history. History   Social History  . Marital Status: Married    Spouse Name: N/A    Number of Children: N/A  . Years of Education: N/A   Social History Main Topics  . Smoking status: Former Smoker    Quit date: 07/23/2009  . Smokeless tobacco: Never Used  . Alcohol Use: No  . Drug Use: No  . Sexual Activity: None   Other Topics Concern  . None   Social History Narrative  . None   Past Surgical History  Procedure Laterality Date  . Back surgery     Past Medical History  Diagnosis Date  . C1-C4 level spinal cord injury with other specified  spinal cord injury, without evidence of spinal bone injury   . Chronic ulcer of skin   . Neurogenic bowel   . Neurogenic bladder   . Congenital quadriplegia   . Anemia   . Asthma   . Blood transfusion    BP 99/58  Pulse 84  Resp 16  Ht 5\' 10"  (1.778 m)  Wt 153 lb (69.4 kg)  BMI 21.95 kg/m2  SpO2 95%     Review of Systems  Musculoskeletal: Positive for gait problem.  All other systems reviewed and are negative.       Objective:   Physical Exam  Constitutional: He is oriented to person, place, and time. He appears well-developed.  Patient is in a wheelchair and tetraplegic  HENT:  Head: Normocephalic.  Eyes: Pupils are equal, round, and reactive to light.  Neurological: He is alert and oriented to person, place, and time.  Skin: Skin is dry.  Psychiatric: He has a normal mood and affect.  Increased motor tone is noted in UE bilateral, worse on the right.Spacisity in UEs . Muscle strength about 2-3/5 in upper extremity, except bilateral hands 1-2/5. LEs 2-3/5 . Restricted range of motion in upper and lower extremities. ROM restricted in UEs bilateral, especially in Abduction 50 degrees on the right , 40 degrees on the left, passive and external rotation 5-10 degrees  passive. Almost no active motion possible in hands bilateral.  Patient in wheel chair.         Assessment & Plan:  This is a 57 year old male with  1. C5 spinal cord injury in 2011  2. Spastic tetraplegia  3. shoulder capsulitis bilaterally right more than left  4. Neurogenic bowel and bladder  Plan :  Refilled narcotics today.  Advised patient and his wife to continue with the stretches, also looked up some web sites, where they can get some good stretching exercises. Follow up in 1 month

## 2012-11-25 ENCOUNTER — Encounter
Payer: Medicare Other | Attending: Physical Medicine and Rehabilitation | Admitting: Physical Medicine and Rehabilitation

## 2012-11-25 ENCOUNTER — Encounter: Payer: Self-pay | Admitting: Physical Medicine and Rehabilitation

## 2012-11-25 VITALS — BP 85/45 | HR 65 | Resp 14 | Ht 70.0 in | Wt 153.0 lb

## 2012-11-25 DIAGNOSIS — G8929 Other chronic pain: Secondary | ICD-10-CM | POA: Insufficient documentation

## 2012-11-25 DIAGNOSIS — S14105S Unspecified injury at C5 level of cervical spinal cord, sequela: Secondary | ICD-10-CM

## 2012-11-25 DIAGNOSIS — K592 Neurogenic bowel, not elsewhere classified: Secondary | ICD-10-CM | POA: Insufficient documentation

## 2012-11-25 DIAGNOSIS — N319 Neuromuscular dysfunction of bladder, unspecified: Secondary | ICD-10-CM | POA: Insufficient documentation

## 2012-11-25 DIAGNOSIS — M7500 Adhesive capsulitis of unspecified shoulder: Secondary | ICD-10-CM | POA: Insufficient documentation

## 2012-11-25 DIAGNOSIS — G8250 Quadriplegia, unspecified: Secondary | ICD-10-CM

## 2012-11-25 DIAGNOSIS — Z87828 Personal history of other (healed) physical injury and trauma: Secondary | ICD-10-CM | POA: Insufficient documentation

## 2012-11-25 DIAGNOSIS — IMO0002 Reserved for concepts with insufficient information to code with codable children: Secondary | ICD-10-CM

## 2012-11-25 MED ORDER — OXYMORPHONE HCL ER 20 MG PO TB12: 20.0000 mg | ORAL_TABLET | Freq: Two times a day (BID) | ORAL | Status: DC | PRN

## 2012-11-25 MED ORDER — OXYCODONE HCL 5 MG PO CAPS: 5.0000 mg | ORAL_CAPSULE | Freq: Four times a day (QID) | ORAL | Status: DC | PRN

## 2012-11-25 NOTE — Patient Instructions (Signed)
Try to stay as active as possible

## 2012-11-25 NOTE — Progress Notes (Signed)
Subjective:    Patient ID: [name], male    DOB: 01-27-1955, 57 y.o.   MRN: [id number]  HPI The patient complains about chronic pain in his neck and shoulders.  The problem has been stable. He reports that the last botox injection has helped to get a better ROM in his LUE. Discussed the importance of passive movement of both shoulders with the patient's wife. I also showed her some exercises and techniques again to improve the range of motion of both shoulders.Also recommended stretches for his achilles tendon.  Patient reports that he has some improvement of his motion of his right hand after the last botox injection. The patient is asking for a replacement of his braces for his hands to prevent flexion contractures. He is also asking for [facility] PT to prevent further contractures of his LE  Pain Inventory Average Pain 4 Pain Right Now 4 My pain is sharp and stabbing  In the last 24 hours, has pain interfered with the following? General activity 5 Relation with others 5 Enjoyment of life 5 What TIME of day is your pain at its worst? evening Sleep (in general) Fair  Pain is worse with: inactivity Pain improves with: medication Relief from Meds: 7  Mobility ability to climb steps?  no do you drive?  no use a wheelchair Do you have any goals in this area?  yes  Function disabled: date disabled na I need assistance with the following:  feeding, dressing, bathing and toileting Do you have any goals in this area?  yes  Neuro/Psych tremor  Prior Studies Any changes since last visit?  no  Physicians involved in your care Any changes since last visit?  no   History reviewed. No pertinent family history. History   Social History  . Marital Status: Married    Spouse Name: N/A    Number of Children: N/A  . Years of Education: N/A   Social History Main Topics  . Smoking status: Former Smoker    Quit date: 07/23/2009  . Smokeless tobacco: Never Used  . Alcohol Use: No   . Drug Use: No  . Sexual Activity: None   Other Topics Concern  . None   Social History Narrative  . None   Past Surgical History  Procedure Laterality Date  . Back surgery     Past Medical History  Diagnosis Date  . C1-C4 level spinal cord injury with other specified spinal cord injury, without evidence of spinal bone injury   . Chronic ulcer of skin   . Neurogenic bowel   . Neurogenic bladder   . Congenital quadriplegia   . Anemia   . Asthma   . Blood transfusion    BP 85/45  Pulse 65  Resp 14  Ht 5\' 10"  (1.778 m)  Wt 153 lb (69.4 kg)  BMI 21.95 kg/m2  SpO2 98%     Review of Systems  Neurological: Positive for tremors.  All other systems reviewed and are negative.       Objective:   Physical Exam Constitutional: He is oriented to person, place, and time. He appears well-developed.  Patient is in a wheelchair and tetraplegic  HENT:  Head: Normocephalic.  Eyes: Pupils are equal, round, and reactive to light.  Neurological: He is alert and oriented to person, place, and time.  Skin: Skin is dry.  Psychiatric: He has a normal mood and affect.  Increased motor tone is noted in UE bilateral, worse on the right.Spacisity in UEs .  Muscle strength about 2-3/5 in upper extremity, except bilateral hands 1-2/5. LEs 2-3/5 . Restricted range of motion in upper and lower extremities. ROM restricted in UEs bilateral, especially in Abduction 50 degrees on the right , 40 degrees on the left, passive and external rotation 5-10 degrees passive. Almost no active motion possible in hands bilateral.  Patient in wheel chair.        Assessment & Plan:  This is a 57 year old male with  1. C5 spinal cord injury in 2011  2. Spastic tetraplegia  3. shoulder capsulitis bilaterally right more than left  4. Neurogenic bowel and bladder  Plan :  Refilled narcotics today.  Advised patient and his wife to continue with the stretches. Ordered HH PT to help with a stretching program  . Ordered braces for his hands to prevent further flexion contractures of his hands and fingers, his old ones are not working anymore. Follow up in 1 month

## 2012-12-18 ENCOUNTER — Other Ambulatory Visit: Payer: Self-pay | Admitting: *Deleted

## 2012-12-18 DIAGNOSIS — G8250 Quadriplegia, unspecified: Secondary | ICD-10-CM

## 2012-12-18 MED ORDER — OXYMORPHONE HCL ER 20 MG PO TB12: 20.0000 mg | ORAL_TABLET | Freq: Two times a day (BID) | ORAL | Status: DC | PRN

## 2012-12-18 MED ORDER — OXYCODONE HCL 5 MG PO CAPS: 5.0000 mg | ORAL_CAPSULE | Freq: Four times a day (QID) | ORAL | Status: DC | PRN

## 2012-12-18 NOTE — Telephone Encounter (Signed)
RX printed early for controlled medication for the visit with RN on 12/29/12 (to be signed by MD)

## 2012-12-30 ENCOUNTER — Encounter: Payer: Self-pay | Admitting: *Deleted

## 2012-12-30 ENCOUNTER — Telehealth: Payer: Self-pay

## 2012-12-30 ENCOUNTER — Encounter: Payer: Medicare Other | Attending: Physical Medicine & Rehabilitation | Admitting: *Deleted

## 2012-12-30 VITALS — BP 89/45 | HR 76 | Resp 14

## 2012-12-30 DIAGNOSIS — S14105S Unspecified injury at C5 level of cervical spinal cord, sequela: Secondary | ICD-10-CM

## 2012-12-30 DIAGNOSIS — M7500 Adhesive capsulitis of unspecified shoulder: Secondary | ICD-10-CM | POA: Insufficient documentation

## 2012-12-30 DIAGNOSIS — N319 Neuromuscular dysfunction of bladder, unspecified: Secondary | ICD-10-CM | POA: Insufficient documentation

## 2012-12-30 DIAGNOSIS — G8250 Quadriplegia, unspecified: Secondary | ICD-10-CM | POA: Insufficient documentation

## 2012-12-30 DIAGNOSIS — K592 Neurogenic bowel, not elsewhere classified: Secondary | ICD-10-CM | POA: Insufficient documentation

## 2012-12-30 DIAGNOSIS — G8929 Other chronic pain: Secondary | ICD-10-CM | POA: Insufficient documentation

## 2012-12-30 NOTE — Telephone Encounter (Signed)
[name] at [facility] called requesting a taxonomy number for provider in order to process patients insurance.  Email has been sent to person who has this information.  Will call [facility] back as soon as we have it.

## 2012-12-30 NOTE — Patient Instructions (Addendum)
Follow up one month with RN for pill count and med refill and 2 month with Dr Riley Kill  or Elita Quick

## 2012-12-30 NOTE — Progress Notes (Signed)
Here for pill count and medication refills. opana ER 20 mg # 60 Fill date 11/25/12    Today NV#2  Oxycodone 5 mg # 90 fill date 11/25/12  Today NV# 7   VSS    Pain level:5  No change in medication list.  He says he still hurts.  He will follow up with me next month for refills and thn see Dr Riley Kill or Pam in 2 months.

## 2012-12-30 NOTE — Telephone Encounter (Signed)
Spoke with Kathlene November at [facility]. Taxonomy number provided.

## 2013-01-22 ENCOUNTER — Other Ambulatory Visit: Payer: Self-pay | Admitting: *Deleted

## 2013-01-22 DIAGNOSIS — G8250 Quadriplegia, unspecified: Secondary | ICD-10-CM

## 2013-01-22 MED ORDER — OXYMORPHONE HCL ER 20 MG PO TB12: 20.0000 mg | ORAL_TABLET | Freq: Two times a day (BID) | ORAL | Status: DC | PRN

## 2013-01-22 MED ORDER — OXYCODONE HCL 5 MG PO CAPS: 5.0000 mg | ORAL_CAPSULE | Freq: Four times a day (QID) | ORAL | Status: DC | PRN

## 2013-01-22 NOTE — Telephone Encounter (Signed)
RX printed early for controlled medication for the visit with RN on 01/27/13 (to be signed by MD) 

## 2013-01-27 ENCOUNTER — Encounter: Payer: Medicare HMO | Attending: Physical Medicine & Rehabilitation | Admitting: *Deleted

## 2013-01-27 ENCOUNTER — Encounter: Payer: Self-pay | Admitting: *Deleted

## 2013-01-27 VITALS — BP 75/44 | HR 67 | Resp 14

## 2013-01-27 DIAGNOSIS — X58XXXA Exposure to other specified factors, initial encounter: Secondary | ICD-10-CM | POA: Insufficient documentation

## 2013-01-27 DIAGNOSIS — M542 Cervicalgia: Secondary | ICD-10-CM | POA: Insufficient documentation

## 2013-01-27 DIAGNOSIS — IMO0002 Reserved for concepts with insufficient information to code with codable children: Secondary | ICD-10-CM | POA: Insufficient documentation

## 2013-01-27 DIAGNOSIS — G8250 Quadriplegia, unspecified: Secondary | ICD-10-CM | POA: Insufficient documentation

## 2013-01-27 DIAGNOSIS — M7500 Adhesive capsulitis of unspecified shoulder: Secondary | ICD-10-CM | POA: Insufficient documentation

## 2013-01-27 DIAGNOSIS — J45909 Unspecified asthma, uncomplicated: Secondary | ICD-10-CM | POA: Insufficient documentation

## 2013-01-27 DIAGNOSIS — M778 Other enthesopathies, not elsewhere classified: Secondary | ICD-10-CM

## 2013-01-27 DIAGNOSIS — M7580 Other shoulder lesions, unspecified shoulder: Secondary | ICD-10-CM

## 2013-01-27 DIAGNOSIS — N319 Neuromuscular dysfunction of bladder, unspecified: Secondary | ICD-10-CM | POA: Insufficient documentation

## 2013-01-27 DIAGNOSIS — S14105A Unspecified injury at C5 level of cervical spinal cord, initial encounter: Secondary | ICD-10-CM

## 2013-01-27 DIAGNOSIS — Z87891 Personal history of nicotine dependence: Secondary | ICD-10-CM | POA: Insufficient documentation

## 2013-01-27 DIAGNOSIS — G8929 Other chronic pain: Secondary | ICD-10-CM | POA: Insufficient documentation

## 2013-01-27 DIAGNOSIS — K592 Neurogenic bowel, not elsewhere classified: Secondary | ICD-10-CM | POA: Insufficient documentation

## 2013-01-27 DIAGNOSIS — Z79899 Other long term (current) drug therapy: Secondary | ICD-10-CM | POA: Insufficient documentation

## 2013-01-27 NOTE — Patient Instructions (Signed)
Follow up with Dr Riley KillSwartz 02/24/13 @ 11:20

## 2013-01-27 NOTE — Progress Notes (Signed)
Here for pill count and medication refills. Opana ER 20 mg #60 Fill date 12/30/12    Today NV# 5 Oxycodone 5 mg IR # 90 Fill date 12/30/12 Today NV# 16   VSS  BP low but he is a spinal injury patient.    Low fall risk due to being chair bound from tetraplegia.  Has appt scheduled to see Dr Riley KillSwartz next month.  Refills given.

## 2013-02-24 ENCOUNTER — Encounter: Payer: Medicare HMO | Admitting: Physical Medicine & Rehabilitation

## 2015-11-22 DIAGNOSIS — G8250 Quadriplegia, unspecified: Secondary | ICD-10-CM

## 2015-11-22 DIAGNOSIS — J189 Pneumonia, unspecified organism: Secondary | ICD-10-CM | POA: Diagnosis not present

## 2015-11-22 DIAGNOSIS — L8990 Pressure ulcer of unspecified site, unspecified stage: Secondary | ICD-10-CM

## 2015-11-23 DIAGNOSIS — G8250 Quadriplegia, unspecified: Secondary | ICD-10-CM | POA: Diagnosis not present

## 2015-11-23 DIAGNOSIS — J189 Pneumonia, unspecified organism: Secondary | ICD-10-CM | POA: Diagnosis not present

## 2015-11-23 DIAGNOSIS — L8990 Pressure ulcer of unspecified site, unspecified stage: Secondary | ICD-10-CM | POA: Diagnosis not present

## 2015-11-24 DIAGNOSIS — A419 Sepsis, unspecified organism: Secondary | ICD-10-CM | POA: Diagnosis not present

## 2015-11-24 DIAGNOSIS — J189 Pneumonia, unspecified organism: Secondary | ICD-10-CM | POA: Diagnosis not present

## 2021-04-19 ENCOUNTER — Telehealth (INDEPENDENT_AMBULATORY_CARE_PROVIDER_SITE_OTHER): Payer: Medicare (Managed Care) | Admitting: Internal Medicine

## 2021-04-19 ENCOUNTER — Other Ambulatory Visit: Payer: Self-pay

## 2021-04-19 ENCOUNTER — Encounter: Payer: Self-pay | Admitting: Internal Medicine

## 2021-04-19 DIAGNOSIS — M86652 Other chronic osteomyelitis, left thigh: Secondary | ICD-10-CM

## 2021-04-19 DIAGNOSIS — L89159 Pressure ulcer of sacral region, unspecified stage: Secondary | ICD-10-CM

## 2021-04-20 ENCOUNTER — Encounter: Payer: Self-pay | Admitting: Internal Medicine

## 2021-04-20 DIAGNOSIS — M86659 Other chronic osteomyelitis, unspecified thigh: Secondary | ICD-10-CM | POA: Insufficient documentation

## 2021-04-20 NOTE — Progress Notes (Signed)
?  [facility]  ? ?I connected with  Dorna Bloom on 04/20/21 by a video enabled telemedicine application as well as phone connection and verified that I am speaking with the correct person using two identifiers. ?  ?I discussed the limitations of evaluation and management by telemedicine. The patient expressed understanding and agreed to proceed. ? ?Location: ?Patient - home ?Physician - [facility] ? ?Duration of visit:  60 minutes ? ? ?Reason for Consult: osteomyelitis   ?Referring Physician: Dr. Charletta Cousin ([facility]) ? ? ? Patient ID: [name], male    DOB: 1955/02/21, 66 y.o.   MRN: [id number] ? ?HPI:   ?New patient evaluation for chronic osteomyelitis of both hips, right ischium, right posterior iliac bone and right sacrum plus an abscess of the left greater trochanter area.  He has a history of quadriplegia and bedbound as a result of a remote cervical traumatic injury and presented to [facility] with fever.  It was initially thought that he had a urinary tract infection but continued to have fever and further work up with an MRI revealed the above findings.  He underwent debridement by surgery and sent out on empiric vancomycin and ceftriaxone. Unfortunately, no cultures were sent so no microbiologic data to guide [place]. He was admitted earlier this month and plan per the hospitalist is for 6 weeks of IV antibiotics through 05/11/21.  No ID input available at [facility].  He is referred here virtually for folllow up of the plan and further recommendations.   ?Records from hospitalization extensively reviewed.   ? ?Past Medical History:  ?Diagnosis Date  ? Anemia   ? Asthma   ? Blood transfusion   ? C1-C4 level spinal cord injury with other specified spinal cord injury, without evidence of spinal bone injury   ? Chronic ulcer of skin   ? Congenital quadriplegia ([place])   ? Neurogenic bladder   ? Neurogenic bowel   ? ? ?Prior to Admission medications    ?Medication Sig Start Date End Date Taking? Authorizing Provider  ?albuterol (VENTOLIN HFA) 108 (90 Base) MCG/ACT inhaler Inhale 2 puffs into the lungs every 6 (six) hours as needed for shortness of breath.   Yes [provider]  ?Ascorbic Acid (VITAMIN C) 1000 MG tablet Take 1,000 mg by mouth daily.   Yes [provider]  ?baclofen (LIORESAL) 10 MG tablet Take 10 mg by mouth 3 (three) times daily.   Yes [provider]  ?BACLOFEN PO Take 10 mg by mouth daily. Take 1 tablet by mouth at lunch for muscle spasms   Yes [provider]  ?calcium carbonate (OSCAL) 1500 (600 Ca) MG TABS tablet Take by mouth daily.   Yes [provider]  ?CEFTRIAXONE SODIUM IV Inject into the vein daily. 2 grams daily until 05/11/2021   Yes [provider]  ?collagenase (SANTYL) 250 UNIT/GM ointment Apply 1 application. topically daily.   Yes [provider]  ?ferrous sulfate 324 MG TBEC Take 324 mg by mouth daily.   Yes [provider]  ?FLEET MINERAL OIL RE Place rectally once a week.   Yes [provider]  ?MELATONIN-PYRIDOXINE ER PO Take 5-10 mg by mouth daily.   Yes [provider]  ?Menthol-Zinc Oxide (CALMOSEPTINE) 0.44-20.6 % OINT Apply topically.   Yes [provider]  ?methadone (DOLOPHINE) 10 MG tablet Take 10 mg by mouth every 12 (twelve) hours.   Yes [provider]  ?  metroNIDAZOLE (FLAGYL) 500 MG tablet 500 mg. Crush 1 tablet and sprinkle into wound beds for wound care; while at [facility] (Monday-Friday)   Yes [provider]  ?mirtazapine (REMERON) 15 MG tablet Take 15 mg by mouth at bedtime. Take 1/2 tablet by mouth once a day at bedtime for appetite   Yes [provider]  ?Multiple Vitamin (MULITIVITAMIN WITH MINERALS) TABS Take 1 tablet by mouth daily.   Yes [provider]  ?pantoprazole (PROTONIX) 40 MG tablet Take 40 mg by mouth daily.   Yes [provider]  ?polyethylene glycol  (MIRALAX / GLYCOLAX) 17 g packet Take 17 g by mouth daily as needed (for constipation).   Yes [provider]  ?pravastatin (PRAVACHOL) 40 MG tablet Take 40 mg by mouth daily.   Yes [provider]  ?pregabalin (LYRICA) 75 MG capsule Take 75 mg by mouth 2 (two) times daily.   Yes [provider]  ?rOPINIRole (REQUIP) 2 MG tablet Take 4 mg by mouth daily. Take 2 tabs (4 mg) once a day 1-2 hrs before bedtime for restless legs   Yes [provider]  ?tamsulosin (FLOMAX) 0.4 MG CAPS capsule Take 0.4 mg by mouth at bedtime.   Yes [provider]  ?VANCOMYCIN HCL IV Inject 750 mg into the vein 2 (two) times daily. 750 mg IV twice daily until 05/11/2021   Yes [provider]  ?aspirin 325 MG tablet Take 325 mg by mouth daily. ?Patient not taking: Reported on 04/19/2021    [provider]  ?baclofen (LIORESAL) 20 MG tablet TAKE 1 TABLET (20 MG TOTAL) BY MOUTH 4 (FOUR) TIMES DAILY. ?Patient not taking: Reported on 04/19/2021 07/11/11   Meredith Staggers, MD  ?cholecalciferol (VITAMIN D) 400 UNITS TABS Take 400 Units by mouth daily. ?Patient not taking: Reported on 04/19/2021    [provider]  ?citalopram (CELEXA) 20 MG tablet Take 20 mg by mouth daily. ?Patient not taking: Reported on 04/19/2021    [provider]  ?Cyanocobalamin (VITAMIN B 12 PO) Take 1 tablet by mouth daily. ?Patient not taking: Reported on 04/19/2021    [provider]  ?doxycycline (MONODOX) 100 MG capsule Take 100 mg by mouth 2 (two) times daily.    [provider]  ?famotidine (PEPCID) 20 MG tablet  02/05/12   [provider]  ?Ferrous Gluconate (IRON) 240 (27 FE) MG TABS Take 1 tablet by mouth daily. ?Patient not taking: Reported on 04/19/2021    [provider]  ?folic acid (FOLVITE) A999333 MCG tablet Take 400 mcg by mouth daily. ?Patient not taking: Reported on 04/19/2021    [provider]  ?gabapentin (NEURONTIN) 600 MG tablet TAKE 1  TABLET THREE TIMES DAILY ?Patient not taking: Reported on 04/19/2021 07/11/11   Meredith Staggers, MD  ?oxycodone (OXY-IR) 5 MG capsule Take 1 capsule (5 mg total) by mouth every 6 (six) hours as needed. For pain ?Patient not taking: Reported on 04/19/2021 01/22/13   Meredith Staggers, MD  ?oxymorphone [facility] ER) 20 MG 12 hr tablet Take 1 tablet (20 mg total) by mouth every 12 (twelve) hours as needed. For pain ?Patient not taking: Reported on 04/19/2021 01/22/13   Meredith Staggers, MD  ?pyridOXINE (VITAMIN B-6) 50 MG tablet Take 50 mg by mouth daily. ?Patient not taking: Reported on 04/19/2021    [provider]  ?senna-docusate (SENOKOT-S) 8.6-50 MG per tablet Take 1 tablet by mouth daily. ?Patient not taking: Reported on 04/19/2021  [provider]  ?temazepam (RESTORIL) 30 MG capsule Take 30 mg by mouth at bedtime. ?Patient not taking: Reported on 04/19/2021    [provider]  ?tiZANidine (ZANAFLEX) 2 MG tablet Take 1 tablet (2 mg total) by mouth every 8 (eight) hours. Take one tab twice daily for one week then increase to three x daily ?Patient not taking: Reported on 04/19/2021 08/22/12   Meredith Staggers, MD  ? ? ?Allergies  ?Allergen Reactions  ? Iodine   ? Shellfish Allergy   ? Shellfish-Derived Products   ? ? ?Social History  ? ?Tobacco Use  ? Smoking status: Former  ?  Types: Cigarettes  ?  Quit date: 07/23/2009  ?  Years since quitting: 11.7  ? Smokeless tobacco: Never  ?Substance Use Topics  ? Alcohol use: Yes  ?  Comment: rarely  ? Drug use: Yes  ?  Types: Marijuana  ?  Comment: occasionally  ? ? ? ?Review of Systems ? Constitutional: negative for fevers and chills ?All other systems reviewed and are negative   ? ?Constitutional: in no apparent distress There were no vitals filed for this visit. ?EYES: anicteric ?Respiratory: normal respiratory effort ?Not able to examine wound ? ?Labs: ?Lab Results  ?Component Value Date  ? WBC 8.1 01/25/2011  ? HGB 8.2 (L) 01/25/2011  ? HCT 27.0  (L) 01/25/2011  ? MCV 80.6 01/25/2011  ? PLT 394 01/25/2011  ?  ?Lab Results  ?Component Value Date  ? CREATININE 0.43 (L) 01/25/2011  ? BUN 13 01/25/2011  ? NA 138 01/25/2011  ? K 4.1 01/25/2011  ? CL 102

## 2021-06-09 ENCOUNTER — Telehealth: Payer: Medicare (Managed Care)

## 2021-06-09 NOTE — Telephone Encounter (Signed)
Staff message sent to provider to confirm timing of follow up, and any needed prescriptions.  Binnie Kand, RN

## 2021-06-09 NOTE — Telephone Encounter (Signed)
Left voice message with Nurse Arline Asp at [facility] to set up virtual visit with patient to discuss MRI results and plan. Advise to return office call for scheduling.  [name]

## 2021-06-09 NOTE — Telephone Encounter (Signed)
Received call from [facility] regarding any additional appointments needed  after MRI results. MRI results noted in epic media. Routing to provider for review and advise.  Eugenia Mcalpine

## 2021-06-12 NOTE — Telephone Encounter (Signed)
Patient scheduled with Dr Luciana Axe 06/21/21 phone visit [facility] [phone].

## 2021-06-12 NOTE — Telephone Encounter (Signed)
Spoke with Lawson Fiscal at [facility] regarding antibiotic orders from Dr. Luciana Axe. Per staff message from Dr. Luciana Axe:  "He is in a nursing home I think if you could just let them know a month of that, then can follow up 6/21.  Doxy 100 mg twice a day and levaquin 500 mg once daily.  thanks"  Lawson Fiscal requested orders be faxed to [phone]. States their provider will then review and their pharmacy can dispense. Fax prepared for Dr. Ephriam Knuckles signature.  Wyvonne Lenz, RN

## 2021-06-13 NOTE — Telephone Encounter (Signed)
Orders faxed to [facility] for 30 day supply of doxycycline and Levaquin per Dr. Luciana Axe.   Sandie Ano, RN

## 2021-06-21 ENCOUNTER — Telehealth: Payer: Medicare (Managed Care) | Admitting: Internal Medicine

## 2021-06-28 ENCOUNTER — Other Ambulatory Visit: Payer: Self-pay

## 2021-06-28 ENCOUNTER — Encounter: Payer: Self-pay | Admitting: Internal Medicine

## 2021-06-28 ENCOUNTER — Ambulatory Visit (INDEPENDENT_AMBULATORY_CARE_PROVIDER_SITE_OTHER): Payer: Medicare (Managed Care) | Admitting: Internal Medicine

## 2021-06-28 DIAGNOSIS — M86659 Other chronic osteomyelitis, unspecified thigh: Secondary | ICD-10-CM | POA: Diagnosis not present

## 2021-06-29 ENCOUNTER — Telehealth: Payer: Self-pay

## 2021-06-29 ENCOUNTER — Encounter: Payer: Self-pay | Admitting: Internal Medicine

## 2021-06-29 MED ORDER — CEFADROXIL 500 MG PO CAPS: 500.0000 mg | ORAL_CAPSULE | Freq: Two times a day (BID) | ORAL | 2 refills | Status: AC

## 2021-06-29 NOTE — Assessment & Plan Note (Signed)
I had a prolonged discussion with the patient and daughter via phone and discussed the issues with the chronic ulcer.  He appears to be at a point of a more palliative approach to his care and I explained that these ulcers and the bone infection will not heal.  He will continue to be at risk of ongoing infection and acute worsening of infection while he remains bedbound.  At this point, we discussed a palliative approach of intermittent antibiotic treatment vs chronic suppression and they have opted for chronic suppression.  I recommend he take doxycycline 100 mg twice a day and cefadroxil 500 mg twice a day for suppression indefinitely.  Hopefully he will then have a prolonged period without significant acute infection issues.  I explained the risk of resistance and continued infection with the open wounds.    All questions answered and he can follow up as needed.

## 2021-06-29 NOTE — Telephone Encounter (Signed)
Per providers request faxed patient's office note to [facility]. Received confirmation that was was successful.  Juanita Laster, RMA

## 2021-06-29 NOTE — Progress Notes (Signed)
   Subjective:    Patient ID: [name], male    DOB: 04-08-1955, 66 y.o.   MRN: [id number]  I connected with  [name] on 06/29/21 by telephone verified that I am speaking with the correct person using two identifiers.   I discussed the limitations of evaluation and management by telemedicine. The patient expressed understanding and agreed to proceed.  Location: Patient - home Physician - clinic  Duration of visit:  30 minutes  HPI This is a return visit for [name] regarding his chronic osteomyelitis related to multiple pressure ulcers.  I saw him in April after a hospitalization at [facility] due to fever and felt likely due to his wounds.  He has been in a nursing home since that time and remains there, though is planning to return home with care from family members.  He is bedbound with a history of quadriplegia.  He was treated for 6 weeks with IV antibiotics and culture from his urine did grow Proteus and I had recommended he complete a course of cefepime.  He then had a follow up MRI and blood tests and his inflammatory markers have increased again with an ESR over 100.  I discussed during his first visit offloading, nutrition and diverting colostomy but he has been unable to offload successfully and is not planning to undergo a diverting colostomy.  He continues to remain bedbound and does have an air mattress.  Wounds have not improved.  Prior to this visit, I did restart him on antibiotics with doxycycline and levaquin, which he continues to take.     Review of Systems  Constitutional:  Negative for chills and fever.  Gastrointestinal:  Negative for diarrhea and nausea.  Skin:  Negative for rash.       Objective:   Physical Exam Neurological:     Mental Status: He is alert.           Assessment & Plan:

## 2021-06-29 NOTE — Telephone Encounter (Signed)
-----   Message from Gardiner Barefoot, MD sent at 06/29/2021  2:52 PM EDT ----- Please forward the progress note to [facility]. thanks

## 2021-11-01 DEATH — deceased
